# Patient Record
Sex: Female | Born: 1960 | Race: Black or African American | Hispanic: No | Marital: Married | Smoking: Never smoker
Health system: Southern US, Community
[De-identification: ages and names within clinical notes are randomized; demographics above are authoritative.]

## PROBLEM LIST (undated history)

## (undated) DIAGNOSIS — M549 Dorsalgia, unspecified: Secondary | ICD-10-CM

## (undated) DIAGNOSIS — F32A Depression, unspecified: Secondary | ICD-10-CM

## (undated) DIAGNOSIS — Z460 Encounter for fitting and adjustment of spectacles and contact lenses: Secondary | ICD-10-CM

## (undated) DIAGNOSIS — F909 Attention-deficit hyperactivity disorder, unspecified type: Secondary | ICD-10-CM

## (undated) DIAGNOSIS — F329 Major depressive disorder, single episode, unspecified: Secondary | ICD-10-CM

## (undated) DIAGNOSIS — M1990 Unspecified osteoarthritis, unspecified site: Secondary | ICD-10-CM

## (undated) DIAGNOSIS — I4891 Unspecified atrial fibrillation: Secondary | ICD-10-CM

## (undated) HISTORY — PX: DILATION AND CURETTAGE OF UTERUS: SHX78

## (undated) HISTORY — PX: UPPER GI ENDOSCOPY: SHX6162

## (undated) HISTORY — PX: COSMETIC SURGERY: SHX468

## (undated) HISTORY — PX: TUBAL LIGATION: SHX77

## (undated) HISTORY — PX: CARPAL TUNNEL RELEASE: SHX101

## (undated) HISTORY — PX: HERNIA REPAIR: SHX51

## (undated) HISTORY — PX: OTHER SURGICAL HISTORY: SHX169

## (undated) HISTORY — DX: Unspecified osteoarthritis, unspecified site: M19.90

---

## 1996-09-25 HISTORY — PX: ABDOMINAL HYSTERECTOMY: SHX81

## 1999-02-11 ENCOUNTER — Other Ambulatory Visit: Admission: RE | Admit: 1999-02-11 | Discharge: 1999-02-11 | Payer: Self-pay | Admitting: Obstetrics and Gynecology

## 1999-03-10 ENCOUNTER — Emergency Department (HOSPITAL_COMMUNITY): Admission: EM | Admit: 1999-03-10 | Discharge: 1999-03-10 | Payer: Self-pay | Admitting: Emergency Medicine

## 1999-07-27 ENCOUNTER — Ambulatory Visit (HOSPITAL_COMMUNITY): Admission: RE | Admit: 1999-07-27 | Discharge: 1999-07-27 | Payer: Self-pay | Admitting: Obstetrics and Gynecology

## 1999-07-27 ENCOUNTER — Encounter (INDEPENDENT_AMBULATORY_CARE_PROVIDER_SITE_OTHER): Payer: Self-pay

## 2000-04-09 ENCOUNTER — Encounter: Payer: Self-pay | Admitting: Obstetrics and Gynecology

## 2000-04-09 ENCOUNTER — Inpatient Hospital Stay (HOSPITAL_COMMUNITY): Admission: AD | Admit: 2000-04-09 | Discharge: 2000-04-09 | Payer: Self-pay | Admitting: Obstetrics and Gynecology

## 2005-07-03 ENCOUNTER — Ambulatory Visit (HOSPITAL_COMMUNITY): Admission: RE | Admit: 2005-07-03 | Discharge: 2005-07-03 | Payer: Self-pay | Admitting: *Deleted

## 2008-04-29 ENCOUNTER — Emergency Department (HOSPITAL_COMMUNITY): Admission: EM | Admit: 2008-04-29 | Discharge: 2008-04-29 | Payer: Self-pay | Admitting: Emergency Medicine

## 2008-12-21 ENCOUNTER — Encounter: Admission: RE | Admit: 2008-12-21 | Discharge: 2008-12-21 | Payer: Self-pay | Admitting: Family Medicine

## 2009-02-10 ENCOUNTER — Encounter: Admission: RE | Admit: 2009-02-10 | Discharge: 2009-02-10 | Payer: Self-pay | Admitting: Occupational Medicine

## 2009-02-26 ENCOUNTER — Encounter: Admission: RE | Admit: 2009-02-26 | Discharge: 2009-02-26 | Payer: Self-pay | Admitting: Family Medicine

## 2009-06-16 ENCOUNTER — Encounter
Admission: RE | Admit: 2009-06-16 | Discharge: 2009-09-14 | Payer: Self-pay | Admitting: Physical Medicine & Rehabilitation

## 2009-06-21 ENCOUNTER — Ambulatory Visit: Payer: Self-pay | Admitting: Physical Medicine & Rehabilitation

## 2009-07-16 ENCOUNTER — Ambulatory Visit: Payer: Self-pay | Admitting: Physical Medicine & Rehabilitation

## 2009-08-13 ENCOUNTER — Ambulatory Visit: Payer: Self-pay | Admitting: Physical Medicine & Rehabilitation

## 2009-09-14 ENCOUNTER — Ambulatory Visit: Payer: Self-pay | Admitting: Physical Medicine & Rehabilitation

## 2010-02-17 ENCOUNTER — Encounter
Admission: RE | Admit: 2010-02-17 | Discharge: 2010-05-18 | Payer: Self-pay | Admitting: Physical Medicine & Rehabilitation

## 2010-02-22 ENCOUNTER — Ambulatory Visit: Payer: Self-pay | Admitting: Physical Medicine & Rehabilitation

## 2010-03-10 ENCOUNTER — Ambulatory Visit: Payer: Self-pay | Admitting: Physical Medicine & Rehabilitation

## 2010-03-29 ENCOUNTER — Ambulatory Visit: Payer: Self-pay | Admitting: Physical Medicine & Rehabilitation

## 2010-05-05 ENCOUNTER — Ambulatory Visit: Payer: Self-pay | Admitting: Physical Medicine & Rehabilitation

## 2010-05-24 ENCOUNTER — Encounter
Admission: RE | Admit: 2010-05-24 | Discharge: 2010-08-22 | Payer: Self-pay | Source: Home / Self Care | Admitting: Physical Medicine & Rehabilitation

## 2010-05-26 ENCOUNTER — Ambulatory Visit: Payer: Self-pay | Admitting: Physical Medicine & Rehabilitation

## 2010-05-26 ENCOUNTER — Emergency Department (HOSPITAL_COMMUNITY): Admission: EM | Admit: 2010-05-26 | Discharge: 2010-05-26 | Payer: Self-pay | Admitting: Emergency Medicine

## 2010-05-26 ENCOUNTER — Inpatient Hospital Stay (HOSPITAL_COMMUNITY): Admission: AD | Admit: 2010-05-26 | Discharge: 2010-05-31 | Payer: Self-pay | Admitting: Psychiatry

## 2010-05-26 ENCOUNTER — Ambulatory Visit: Payer: Self-pay | Admitting: Psychiatry

## 2010-06-24 ENCOUNTER — Ambulatory Visit: Payer: Self-pay | Admitting: Physical Medicine & Rehabilitation

## 2010-07-11 ENCOUNTER — Ambulatory Visit: Payer: Self-pay | Admitting: Physical Medicine & Rehabilitation

## 2010-12-08 LAB — RAPID URINE DRUG SCREEN, HOSP PERFORMED
Barbiturates: NOT DETECTED
Cocaine: NOT DETECTED
Opiates: NOT DETECTED

## 2010-12-08 LAB — DIFFERENTIAL
Basophils Absolute: 0 10*3/uL (ref 0.0–0.1)
Basophils Relative: 1 % (ref 0–1)
Eosinophils Absolute: 0.1 10*3/uL (ref 0.0–0.7)
Monocytes Relative: 5 % (ref 3–12)
Neutro Abs: 2.8 10*3/uL (ref 1.7–7.7)
Neutrophils Relative %: 61 % (ref 43–77)

## 2010-12-08 LAB — BASIC METABOLIC PANEL
Calcium: 9.2 mg/dL (ref 8.4–10.5)
Creatinine, Ser: 0.78 mg/dL (ref 0.4–1.2)
GFR calc Af Amer: >60 mL/min (ref >60)
GFR calc non Af Amer: >60 mL/min (ref >60)
Glucose, Bld: 98 mg/dL (ref 70–99)
Sodium: 141 mEq/L (ref 135–145)

## 2010-12-08 LAB — CBC
Hemoglobin: 13.4 g/dL (ref 12.0–15.0)
MCH: 30.3 pg (ref 26.0–34.0)
MCHC: 34.1 g/dL (ref 30.0–36.0)
Platelets: 190 10*3/uL (ref 150–400)

## 2010-12-08 LAB — LIPID PANEL
HDL: 90 mg/dL (ref >39)
Total CHOL/HDL Ratio: 2.7 RATIO
Triglycerides: 69 mg/dL (ref <150)
VLDL: 14 mg/dL (ref 0–40)

## 2011-06-23 LAB — DIFFERENTIAL
Basophils Absolute: 0.1
Eosinophils Absolute: 0.1
Eosinophils Relative: 2
Lymphocytes Relative: 42
Lymphs Abs: 1.9
Monocytes Absolute: 0.2

## 2011-06-23 LAB — COMPREHENSIVE METABOLIC PANEL
ALT: 35
AST: 33
Albumin: 3.6
CO2: 28
Chloride: 107
Creatinine, Ser: 0.72
GFR calc Af Amer: >60
GFR calc non Af Amer: >60
Sodium: 141
Total Bilirubin: 0.8

## 2011-06-23 LAB — CBC
MCV: 89
Platelets: 211
RBC: 4.43
WBC: 4.6

## 2011-06-23 LAB — POCT CARDIAC MARKERS
Myoglobin, poc: 25.3
Troponin i, poc: <0.05
Troponin i, poc: <0.05

## 2011-07-04 ENCOUNTER — Ambulatory Visit: Payer: No Typology Code available for payment source | Admitting: Physical Therapy

## 2011-07-05 ENCOUNTER — Ambulatory Visit: Payer: No Typology Code available for payment source | Attending: Orthopaedic Surgery | Admitting: Physical Therapy

## 2011-07-05 DIAGNOSIS — M545 Low back pain, unspecified: Secondary | ICD-10-CM | POA: Insufficient documentation

## 2011-07-05 DIAGNOSIS — M546 Pain in thoracic spine: Secondary | ICD-10-CM | POA: Insufficient documentation

## 2011-07-05 DIAGNOSIS — IMO0001 Reserved for inherently not codable concepts without codable children: Secondary | ICD-10-CM | POA: Insufficient documentation

## 2011-07-05 DIAGNOSIS — M2560 Stiffness of unspecified joint, not elsewhere classified: Secondary | ICD-10-CM | POA: Insufficient documentation

## 2011-07-10 ENCOUNTER — Ambulatory Visit: Payer: No Typology Code available for payment source | Admitting: Physical Therapy

## 2011-07-11 ENCOUNTER — Ambulatory Visit: Payer: No Typology Code available for payment source | Admitting: Physical Therapy

## 2011-07-12 ENCOUNTER — Encounter: Payer: No Typology Code available for payment source | Admitting: Physical Therapy

## 2011-07-14 ENCOUNTER — Ambulatory Visit: Payer: No Typology Code available for payment source | Admitting: Physical Therapy

## 2011-07-18 ENCOUNTER — Ambulatory Visit: Payer: No Typology Code available for payment source | Admitting: Physical Therapy

## 2011-07-20 ENCOUNTER — Ambulatory Visit: Payer: No Typology Code available for payment source | Admitting: Physical Therapy

## 2011-07-26 ENCOUNTER — Ambulatory Visit: Payer: No Typology Code available for payment source | Admitting: Physical Therapy

## 2012-05-25 ENCOUNTER — Encounter (HOSPITAL_BASED_OUTPATIENT_CLINIC_OR_DEPARTMENT_OTHER): Payer: Self-pay | Admitting: *Deleted

## 2012-05-25 ENCOUNTER — Emergency Department (HOSPITAL_BASED_OUTPATIENT_CLINIC_OR_DEPARTMENT_OTHER)
Admission: EM | Admit: 2012-05-25 | Discharge: 2012-05-25 | Disposition: A | Discharge status: Home / Self Care | Payer: Self-pay | Attending: Emergency Medicine | Admitting: Emergency Medicine

## 2012-05-25 ENCOUNTER — Emergency Department (HOSPITAL_BASED_OUTPATIENT_CLINIC_OR_DEPARTMENT_OTHER): Payer: Self-pay

## 2012-05-25 DIAGNOSIS — R040 Epistaxis: Secondary | ICD-10-CM | POA: Insufficient documentation

## 2012-05-25 DIAGNOSIS — K529 Noninfective gastroenteritis and colitis, unspecified: Secondary | ICD-10-CM

## 2012-05-25 DIAGNOSIS — F909 Attention-deficit hyperactivity disorder, unspecified type: Secondary | ICD-10-CM | POA: Insufficient documentation

## 2012-05-25 DIAGNOSIS — R7989 Other specified abnormal findings of blood chemistry: Secondary | ICD-10-CM | POA: Insufficient documentation

## 2012-05-25 DIAGNOSIS — K5289 Other specified noninfective gastroenteritis and colitis: Secondary | ICD-10-CM | POA: Insufficient documentation

## 2012-05-25 DIAGNOSIS — Z79899 Other long term (current) drug therapy: Secondary | ICD-10-CM | POA: Insufficient documentation

## 2012-05-25 DIAGNOSIS — N390 Urinary tract infection, site not specified: Secondary | ICD-10-CM | POA: Insufficient documentation

## 2012-05-25 HISTORY — DX: Attention-deficit hyperactivity disorder, unspecified type: F90.9

## 2012-05-25 LAB — CBC WITH DIFFERENTIAL/PLATELET
Basophils Absolute: 0 10*3/uL (ref 0.0–0.1)
Lymphocytes Relative: 39 % (ref 12–46)
Lymphs Abs: 2 10*3/uL (ref 0.7–4.0)
Neutro Abs: 2.4 10*3/uL (ref 1.7–7.7)
Platelets: 200 10*3/uL (ref 150–400)
RBC: 4.38 MIL/uL (ref 3.87–5.11)
RDW: 13.5 % (ref 11.5–15.5)
WBC: 5.1 10*3/uL (ref 4.0–10.5)

## 2012-05-25 LAB — URINE MICROSCOPIC-ADD ON

## 2012-05-25 LAB — URINALYSIS, ROUTINE W REFLEX MICROSCOPIC
Bilirubin Urine: NEGATIVE
Glucose, UA: NEGATIVE mg/dL
Hgb urine dipstick: NEGATIVE
Protein, ur: NEGATIVE mg/dL

## 2012-05-25 LAB — COMPREHENSIVE METABOLIC PANEL
ALT: 106 U/L — ABNORMAL HIGH (ref 0–35)
AST: 60 U/L — ABNORMAL HIGH (ref 0–37)
Alkaline Phosphatase: 164 U/L — ABNORMAL HIGH (ref 39–117)
CO2: 28 mEq/L (ref 19–32)
Chloride: 104 mEq/L (ref 96–112)
GFR calc non Af Amer: >90 mL/min (ref >90)
Potassium: 3.9 mEq/L (ref 3.5–5.1)
Sodium: 140 mEq/L (ref 135–145)
Total Bilirubin: 0.3 mg/dL (ref 0.3–1.2)

## 2012-05-25 MED ORDER — HYDROMORPHONE HCL PF 1 MG/ML IJ SOLN
1.0000 mg | Freq: Once | INTRAMUSCULAR | Status: AC
  Administered 2012-05-25: 1 mg via INTRAVENOUS
  Filled 2012-05-25: qty 1

## 2012-05-25 MED ORDER — ONDANSETRON 8 MG PO TBDP: 8.0000 mg | ORAL_TABLET | Freq: Three times a day (TID) | ORAL | Status: AC | PRN

## 2012-05-25 MED ORDER — IOHEXOL 300 MG/ML  SOLN
100.0000 mL | Freq: Once | INTRAMUSCULAR | Status: AC | PRN
  Administered 2012-05-25: 100 mL via INTRAVENOUS

## 2012-05-25 MED ORDER — SODIUM CHLORIDE 0.9 % IV BOLUS (SEPSIS)
1000.0000 mL | Freq: Once | INTRAVENOUS | Status: AC
  Administered 2012-05-25: 1000 mL via INTRAVENOUS

## 2012-05-25 MED ORDER — ONDANSETRON HCL 4 MG/2ML IJ SOLN
4.0000 mg | Freq: Once | INTRAMUSCULAR | Status: AC
  Administered 2012-05-25: 4 mg via INTRAVENOUS
  Filled 2012-05-25: qty 2

## 2012-05-25 MED ORDER — CIPROFLOXACIN HCL 500 MG PO TABS: 500.0000 mg | ORAL_TABLET | Freq: Two times a day (BID) | ORAL | Status: AC

## 2012-05-25 MED ORDER — METRONIDAZOLE 500 MG PO TABS: 500.0000 mg | ORAL_TABLET | Freq: Two times a day (BID) | ORAL | Status: AC

## 2012-05-25 NOTE — ED Notes (Addendum)
Patient states she went to urgent care on Thursday for UTI & nausea,  Was given a prescription for cipro and promethazine. Came in today due to nosebleed and rash on lower body, She stated that she was concerned because she read that nosebleeds are an indicator of possible renal failure, Experiencing chills at night, Still experiencing nausea after eating. C/O lower back & abd pain

## 2012-05-25 NOTE — ED Provider Notes (Signed)
History     CSN: [id number]  Arrival date & time 05/25/12  1011   First MD Initiated Contact with Patient 05/25/12 1043      Chief Complaint  Patient presents with  . Epistaxis    (Consider location/radiation/quality/duration/timing/severity/associated sxs/prior treatment) HPI Patient diagnosed with uti on Thursday and treated with phenergan and started cipro.  Patient had back pain and suprapubic tenderness.  Patient has had 10 days of symptoms beginning with urgency, frequency, followed by temp to 103 and chills Wednesday to Thursday.  Patient with nausea but continues to take cipro.  Awoke with nosebleed for 15 minutes today.  Controlled with placing head back and cool towel.  Denies history of nosebleeds.  Patient states rash on legs with multiple visits to dermatolgist for same but states similar to  One that goes along with renal failure, denies history of same.  Denies history of lupus.   Past Medical History  Diagnosis Date  . ADHD (attention deficit hyperactivity disorder)     Past Surgical History  Procedure Date  . Cesarean section   . Abdominal hysterectomy   . Cosmetic surgery     stomach  . Ovary       rupture    No family history on file.  History  Substance Use Topics  . Smoking status: Never Smoker   . Smokeless tobacco: Not on file  . Alcohol Use: No    OB History    Grav Para Term Preterm Abortions TAB SAB Ect Mult Living                  Review of Systems  Constitutional: Negative for fever, chills, activity change, appetite change and unexpected weight change.  HENT: Negative for sore throat, rhinorrhea, neck pain, neck stiffness and sinus pressure.   Eyes: Negative for visual disturbance.  Respiratory: Negative for cough and shortness of breath.   Cardiovascular: Negative for chest pain and leg swelling.  Gastrointestinal: Negative for vomiting, abdominal pain, diarrhea and blood in stool.  Genitourinary: Negative for dysuria, urgency,  frequency, vaginal discharge and difficulty urinating.  Musculoskeletal: Negative for myalgias, arthralgias and gait problem.  Skin: Negative for color change and rash.  Neurological: Negative for weakness, light-headedness and headaches.  Hematological: Does not bruise/bleed easily.  Psychiatric/Behavioral: Negative for dysphoric mood.    Allergies  Review of patient's allergies indicates no known allergies.  Home Medications   Current Outpatient Rx  Name Route Sig Dispense Refill  . ATOMOXETINE HCL 60 MG PO CAPS Oral Take 60 mg by mouth daily.    . WELLBUTRIN PO Oral Take by mouth.    Marland Kitchen VITAMIN B-12 ER PO Oral Take by mouth.    Marland Kitchen PRISTIQ PO Oral Take by mouth.    Marland Kitchen GABAPENTIN PO Oral Take by mouth as needed.    Marland Kitchen GLUCOSE BLOOD VI STRP Other 1 each by Other route as needed. Use as instructed    . MULTI-VITAMIN/MINERALS PO TABS Oral Take 1 tablet by mouth daily.    Marland Kitchen VITAMIN D MAINTENANCE PO Oral Take by mouth.    Marland Kitchen PROBIOTIC DAILY PO Oral Take by mouth.      BP 140/68  Temp 98.4 F (36.9 C) (Oral)  Resp 19  SpO2 100%  Physical Exam  Nursing note and vitals reviewed. Constitutional: She is oriented to person, place, and time. She appears well-developed and well-nourished.  HENT:  Head: Atraumatic.  Mouth/Throat: Oropharynx is clear and moist.  Eyes: Conjunctivae and EOM are normal.  Pupils are equal, round, and reactive to light.  Neck: Normal range of motion. Neck supple.  Cardiovascular: Normal rate, regular rhythm and normal heart sounds.   Pulmonary/Chest: Effort normal and breath sounds normal.  Abdominal: Soft.       Moderate diffuse abdominal tenderness, right cva tenderness  Musculoskeletal: Normal range of motion.  Neurological: She is alert and oriented to person, place, and time.  Skin: Skin is warm.       Annular rash bilateral lower extremities.   Psychiatric: She has a normal mood and affect. Her behavior is normal. Judgment and thought content normal.     ED Course  Procedures (including critical care time)  Labs Reviewed - No data to display No results found.   No diagnosis found.  Results for orders placed during the hospital encounter of 05/25/12  CBC WITH DIFFERENTIAL      Component Value Range   WBC 5.1  4.0 - 10.5 K/uL   RBC 4.38  3.87 - 5.11 MIL/uL   Hemoglobin 12.9  12.0 - 15.0 g/dL   HCT 40.9  81.1 - 91.4 %   MCV 89.3  78.0 - 100.0 fL   MCH 29.5  26.0 - 34.0 pg   MCHC 33.0  30.0 - 36.0 g/dL   RDW 78.2  95.6 - 21.3 %   Platelets 200  150 - 400 K/uL   Neutrophils Relative 48  43 - 77 %   Neutro Abs 2.4  1.7 - 7.7 K/uL   Lymphocytes Relative 39  12 - 46 %   Lymphs Abs 2.0  0.7 - 4.0 K/uL   Monocytes Relative 11  3 - 12 %   Monocytes Absolute 0.6  0.1 - 1.0 K/uL   Eosinophils Relative 1  0 - 5 %   Eosinophils Absolute 0.1  0.0 - 0.7 K/uL   Basophils Relative 0  0 - 1 %   Basophils Absolute 0.0  0.0 - 0.1 K/uL  COMPREHENSIVE METABOLIC PANEL      Component Value Range   Sodium 140  135 - 145 mEq/L   Potassium 3.9  3.5 - 5.1 mEq/L   Chloride 104  96 - 112 mEq/L   CO2 28  19 - 32 mEq/L   Glucose, Bld 112 (*) 70 - 99 mg/dL   BUN 10  6 - 23 mg/dL   Creatinine, Ser 0.86  0.50 - 1.10 mg/dL   Calcium 9.2  8.4 - 57.8 mg/dL   Total Protein 7.6  6.0 - 8.3 g/dL   Albumin 3.4 (*) 3.5 - 5.2 g/dL   AST 60 (*) 0 - 37 U/L   ALT 106 (*) 0 - 35 U/L   Alkaline Phosphatase 164 (*) 39 - 117 U/L   Total Bilirubin 0.3  0.3 - 1.2 mg/dL   GFR calc non Af Amer >90  >90 mL/min   GFR calc Af Amer >90  >90 mL/min  LIPASE, BLOOD      Component Value Range   Lipase 23  11 - 59 U/L  URINALYSIS, ROUTINE W REFLEX MICROSCOPIC      Component Value Range   Color, Urine YELLOW  YELLOW   APPearance CLEAR  CLEAR   Specific Gravity, Urine 1.018  1.005 - 1.030   pH 5.0  5.0 - 8.0   Glucose, UA NEGATIVE  NEGATIVE mg/dL   Hgb urine dipstick NEGATIVE  NEGATIVE   Bilirubin Urine NEGATIVE  NEGATIVE   Ketones, ur NEGATIVE  NEGATIVE mg/dL    Protein,  ur NEGATIVE  NEGATIVE mg/dL   Urobilinogen, UA 0.2  0.0 - 1.0 mg/dL   Nitrite NEGATIVE  NEGATIVE   Leukocytes, UA MODERATE (*) NEGATIVE  URINE MICROSCOPIC-ADD ON      Component Value Range   Squamous Epithelial / LPF FEW (*) RARE   WBC, UA 11-20  <3 WBC/hpf   Bacteria, UA FEW (*) RARE   Urine-Other MUCOUS PRESENT       MDM   Patient complaining of abdominal pain  With history of recently diagnosed uti.  She had also had a nose bleed this a.m. And was concerned because she was reading of possible associations on line.  Patient with increased stool on ct- patient states always constipated but no new change.  LFTs elevated and discussed with patient possible etiologies and to avoid liver toxic substances and have lfts rechecked this week.  Also regional enteritis seen on ct, patient will have cipro extended to 10 days and flagyl prescried.        Hilario Quarry, MD 05/25/12 [phone]

## 2013-03-04 ENCOUNTER — Emergency Department (HOSPITAL_BASED_OUTPATIENT_CLINIC_OR_DEPARTMENT_OTHER)
Admission: EM | Admit: 2013-03-04 | Discharge: 2013-03-04 | Disposition: A | Discharge status: Home / Self Care | Payer: Medicare Other | Attending: Emergency Medicine | Admitting: Emergency Medicine

## 2013-03-04 ENCOUNTER — Encounter (HOSPITAL_BASED_OUTPATIENT_CLINIC_OR_DEPARTMENT_OTHER): Payer: Self-pay | Admitting: *Deleted

## 2013-03-04 ENCOUNTER — Emergency Department (HOSPITAL_BASED_OUTPATIENT_CLINIC_OR_DEPARTMENT_OTHER): Payer: Medicare Other

## 2013-03-04 DIAGNOSIS — K2970 Gastritis, unspecified, without bleeding: Secondary | ICD-10-CM | POA: Insufficient documentation

## 2013-03-04 DIAGNOSIS — R5381 Other malaise: Secondary | ICD-10-CM | POA: Insufficient documentation

## 2013-03-04 DIAGNOSIS — R5383 Other fatigue: Secondary | ICD-10-CM | POA: Insufficient documentation

## 2013-03-04 DIAGNOSIS — Z8679 Personal history of other diseases of the circulatory system: Secondary | ICD-10-CM | POA: Insufficient documentation

## 2013-03-04 DIAGNOSIS — Z79899 Other long term (current) drug therapy: Secondary | ICD-10-CM | POA: Insufficient documentation

## 2013-03-04 DIAGNOSIS — F909 Attention-deficit hyperactivity disorder, unspecified type: Secondary | ICD-10-CM | POA: Insufficient documentation

## 2013-03-04 DIAGNOSIS — R531 Weakness: Secondary | ICD-10-CM

## 2013-03-04 HISTORY — DX: Unspecified atrial fibrillation: I48.91

## 2013-03-04 LAB — URINALYSIS, ROUTINE W REFLEX MICROSCOPIC
Glucose, UA: NEGATIVE mg/dL
Hgb urine dipstick: NEGATIVE
Protein, ur: NEGATIVE mg/dL
Specific Gravity, Urine: 1.025 (ref 1.005–1.030)
pH: 6 (ref 5.0–8.0)

## 2013-03-04 LAB — CBC WITH DIFFERENTIAL/PLATELET
Basophils Absolute: 0 10*3/uL (ref 0.0–0.1)
Basophils Relative: 0 % (ref 0–1)
Eosinophils Absolute: 0.1 10*3/uL (ref 0.0–0.7)
Eosinophils Relative: 1 % (ref 0–5)
Lymphocytes Relative: 50 % — ABNORMAL HIGH (ref 12–46)
MCHC: 33.7 g/dL (ref 30.0–36.0)
MCV: 88.5 fL (ref 78.0–100.0)
Monocytes Absolute: 0.3 10*3/uL (ref 0.1–1.0)
Platelets: 183 10*3/uL (ref 150–400)
RDW: 13.3 % (ref 11.5–15.5)
WBC: 5.8 10*3/uL (ref 4.0–10.5)

## 2013-03-04 LAB — D-DIMER, QUANTITATIVE: D-Dimer, Quant: <0.27 ug/mL-FEU (ref 0.00–0.48)

## 2013-03-04 LAB — COMPREHENSIVE METABOLIC PANEL
ALT: 13 U/L (ref 0–35)
AST: 18 U/L (ref 0–37)
Albumin: 3.7 g/dL (ref 3.5–5.2)
CO2: 26 mEq/L (ref 19–32)
Calcium: 9.4 mg/dL (ref 8.4–10.5)
Creatinine, Ser: 0.6 mg/dL (ref 0.50–1.10)
Sodium: 141 mEq/L (ref 135–145)
Total Protein: 7.3 g/dL (ref 6.0–8.3)

## 2013-03-04 LAB — URINE MICROSCOPIC-ADD ON

## 2013-03-04 MED ORDER — SODIUM CHLORIDE 0.9 % IV BOLUS (SEPSIS)
1000.0000 mL | Freq: Once | INTRAVENOUS | Status: AC
  Administered 2013-03-04: 1000 mL via INTRAVENOUS

## 2013-03-04 MED ORDER — PANTOPRAZOLE SODIUM 40 MG IV SOLR
40.0000 mg | Freq: Once | INTRAVENOUS | Status: AC
  Administered 2013-03-04: 40 mg via INTRAVENOUS
  Filled 2013-03-04: qty 40

## 2013-03-04 NOTE — ED Provider Notes (Signed)
History     CSN: [id number]  Arrival date & time 03/04/13  1539   First MD Initiated Contact with Patient 03/04/13 1545      Chief Complaint  Patient presents with  . Chest Pain    (Consider location/radiation/quality/duration/timing/severity/associated sxs/prior treatment) Patient is a 52 y.o. female presenting with chest pain.  Chest Pain  Pt with reported history of atrial fibrillation reports she has had general weakness for the last week or so. She has noticed some mild SOB for 3 days, no wheezing, cough, orthopnea or DOE. She was 'sweating profusely' 3 days ago as well and has had occasional intermittent mild aching pain in lower chest, although she points to her epigastric area No particular provoking or relieving factors. She reports family history of CAD, but no other CAD or PE risk factors. She has been taking ASA since symptoms started. Was taking Motrin occasionally prior to that but not every day. She is a moderate drinker, drinks approx 2 bottles of wine per week. Denies any nausea or vomiting.  Past Medical History  Diagnosis Date  . ADHD (attention deficit hyperactivity disorder)   . Atrial fibrillation     Past Surgical History  Procedure Laterality Date  . Cesarean section    . Abdominal hysterectomy    . Cosmetic surgery      stomach  . Ovary        rupture    No family history on file.  History  Substance Use Topics  . Smoking status: Never Smoker   . Smokeless tobacco: Not on file  . Alcohol Use: Yes    OB History   Grav Para Term Preterm Abortions TAB SAB Ect Mult Living                  Review of Systems  Cardiovascular: Positive for chest pain.   All other systems reviewed and are negative except as noted in HPI.   Allergies  Review of patient's allergies indicates no known allergies.  Home Medications   Current Outpatient Rx  Name  Route  Sig  Dispense  Refill  . atomoxetine (STRATTERA) 60 MG capsule   Oral   Take 60 mg by mouth  daily.         . BuPROPion HCl (WELLBUTRIN PO)   Oral   Take by mouth.         . Cyanocobalamin (VITAMIN B-12 ER PO)   Oral   Take by mouth.         . Desvenlafaxine Succinate (PRISTIQ PO)   Oral   Take by mouth.         Marland Kitchen GABAPENTIN PO   Oral   Take by mouth as needed.         Marland Kitchen glucose blood test strip   Other   1 each by Other route as needed. Use as instructed         . Multiple Vitamins-Minerals (MULTIVITAMIN WITH MINERALS) tablet   Oral   Take 1 tablet by mouth daily.         . Nutritional Supplements (VITAMIN D MAINTENANCE PO)   Oral   Take by mouth.         . Probiotic Product (PROBIOTIC DAILY PO)   Oral   Take by mouth.           BP 114/80  Pulse 81  Temp(Src) 98.3 F (36.8 C) (Oral)  Resp 20  SpO2 100%  Physical Exam  Nursing note and  vitals reviewed. Constitutional: She is oriented to person, place, and time. She appears well-developed and well-nourished.  HENT:  Head: Normocephalic and atraumatic.  Eyes: EOM are normal. Pupils are equal, round, and reactive to light.  Neck: Normal range of motion. Neck supple.  Cardiovascular: Normal rate, normal heart sounds and intact distal pulses.   Pulmonary/Chest: Effort normal and breath sounds normal. She exhibits no tenderness.  Abdominal: Bowel sounds are normal. She exhibits no distension. There is tenderness (mild epigastric, reproduces pain). There is no rebound and no guarding.  Musculoskeletal: Normal range of motion. She exhibits no edema and no tenderness.  Neurological: She is alert and oriented to person, place, and time. She has normal strength. No cranial nerve deficit or sensory deficit.  Skin: Skin is warm and dry. No rash noted.  Psychiatric: She has a normal mood and affect.    ED Course  Procedures (including critical care time)  Labs Reviewed  CBC WITH DIFFERENTIAL - Abnormal; Notable for the following:    Lymphocytes Relative 50 (*)    All other components  within normal limits  COMPREHENSIVE METABOLIC PANEL - Abnormal; Notable for the following:    Glucose, Bld 107 (*)    Alkaline Phosphatase 136 (*)    Total Bilirubin 0.2 (*)    All other components within normal limits  URINALYSIS, ROUTINE W REFLEX MICROSCOPIC - Abnormal; Notable for the following:    Ketones, ur 15 (*)    Leukocytes, UA SMALL (*)    All other components within normal limits  LIPASE, BLOOD  D-DIMER, QUANTITATIVE  URINE MICROSCOPIC-ADD ON   Dg Chest 2 View  03/04/2013   *RADIOLOGY REPORT*  Clinical Data: Chest pain. Shortness of breath.  CHEST - 2 VIEW  Comparison: 05/25/2012  Findings: Oval shaped 0.7 x 0.4 cm density projects over the anterior left second rib near the crossover with the fourth rib and scapula.  The lungs appear otherwise clear. Cardiac and mediastinal contours appear unremarkable.  No pleural effusion observed.  IMPRESSION: 1.  Small nodular density left lung apex probably represents a bony lesion. The likelihood that this represents a neoplastic pulmonary lesions is very low.  Consider apical lordotic view of the chest for further characterization.  Otherwise negative exam.   Original Report Authenticated By: Gaylyn Rong, M.D.     1. Gastritis   2. General weakness       MDM   Date: 03/04/2013  Rate: 77  Rhythm: normal sinus rhythm  QRS Axis: normal  Intervals: normal  ST/T Wave abnormalities: normal  Conduction Disutrbances: none  Narrative Interpretation: unremarkable  Pt's reported history of atrial fibrillation is not clear. She states she was seen by a cardiologist who told her she had afib and had her wear a 30 day monitor, but she never heard any results from it and never followed up. Not currently on any treatment.    5:56 PM Pt feeling better. Labs and imaging reviewed. Pt informed of incidental finding on CXR and advised PCP follow up for further evaluation of her general weakness. I suspect her epigastric pain is gastritis or  less likely PUD. PCP follow up is already scheduled for next week.         Charles B. Bernette Mayers, MD 03/04/13 1759

## 2013-03-04 NOTE — ED Notes (Signed)
Chest pain and sob x 3 days. Went to UC this am and left without being seen. Hx of atrial fib.

## 2013-06-20 ENCOUNTER — Encounter (HOSPITAL_BASED_OUTPATIENT_CLINIC_OR_DEPARTMENT_OTHER): Payer: Self-pay | Admitting: *Deleted

## 2013-06-20 NOTE — Progress Notes (Signed)
Pt recently had carpal tunnel-has infection started on antibiotic 06/19/13-told to call dr Irven Coe office. Saw a cardiologist 6-7 yr ago for /af-cannot remember who-was in er 6/14 cp-ruled as gastritis-no meds-

## 2013-06-23 ENCOUNTER — Encounter (HOSPITAL_BASED_OUTPATIENT_CLINIC_OR_DEPARTMENT_OTHER): Payer: Self-pay | Admitting: Anesthesiology

## 2013-06-23 ENCOUNTER — Ambulatory Visit (HOSPITAL_BASED_OUTPATIENT_CLINIC_OR_DEPARTMENT_OTHER): Payer: BC Managed Care – PPO | Admitting: Anesthesiology

## 2013-06-23 ENCOUNTER — Encounter (HOSPITAL_BASED_OUTPATIENT_CLINIC_OR_DEPARTMENT_OTHER)
Admission: RE | Disposition: A | Discharge status: Home / Self Care | Payer: Self-pay | Source: Ambulatory Visit | Attending: Specialist

## 2013-06-23 ENCOUNTER — Encounter (HOSPITAL_BASED_OUTPATIENT_CLINIC_OR_DEPARTMENT_OTHER): Payer: Self-pay | Admitting: *Deleted

## 2013-06-23 ENCOUNTER — Ambulatory Visit (HOSPITAL_BASED_OUTPATIENT_CLINIC_OR_DEPARTMENT_OTHER)
Admission: RE | Admit: 2013-06-23 | Discharge: 2013-06-23 | Disposition: A | Discharge status: Home / Self Care | Payer: BC Managed Care – PPO | Source: Ambulatory Visit | Attending: Specialist | Admitting: Specialist

## 2013-06-23 DIAGNOSIS — N62 Hypertrophy of breast: Secondary | ICD-10-CM | POA: Insufficient documentation

## 2013-06-23 HISTORY — DX: Major depressive disorder, single episode, unspecified: F32.9

## 2013-06-23 HISTORY — DX: Depression, unspecified: F32.A

## 2013-06-23 HISTORY — PX: BREAST REDUCTION SURGERY: SHX8

## 2013-06-23 HISTORY — DX: Encounter for fitting and adjustment of spectacles and contact lenses: Z46.0

## 2013-06-23 SURGERY — MAMMOPLASTY, REDUCTION
Anesthesia: General | Site: Breast | Laterality: Bilateral | Wound class: Clean

## 2013-06-23 MED ORDER — CEFAZOLIN SODIUM-DEXTROSE 2-3 GM-% IV SOLR
2.0000 g | INTRAVENOUS | Status: AC
  Administered 2013-06-23: 2 g via INTRAVENOUS

## 2013-06-23 MED ORDER — OXYCODONE HCL 5 MG PO TABS: 5.0000 mg | ORAL_TABLET | Freq: Once | ORAL | Status: DC | PRN

## 2013-06-23 MED ORDER — SCOPOLAMINE 1 MG/3DAYS TD PT72: 1.0000 | MEDICATED_PATCH | Freq: Once | TRANSDERMAL | Status: DC

## 2013-06-23 MED ORDER — MORPHINE SULFATE 10 MG/ML IJ SOLN
INTRAMUSCULAR | Status: DC | PRN
  Administered 2013-06-23: 2 mg via INTRAVENOUS
  Administered 2013-06-23: 4 mg via INTRAVENOUS

## 2013-06-23 MED ORDER — LABETALOL HCL 5 MG/ML IV SOLN
INTRAVENOUS | Status: DC | PRN
  Administered 2013-06-23: 5 mg via INTRAVENOUS

## 2013-06-23 MED ORDER — HYDROMORPHONE HCL PF 1 MG/ML IJ SOLN
0.2500 mg | INTRAMUSCULAR | Status: DC | PRN
  Administered 2013-06-23 (×2): 0.5 mg via INTRAVENOUS

## 2013-06-23 MED ORDER — ONDANSETRON HCL 4 MG/2ML IJ SOLN
INTRAMUSCULAR | Status: DC | PRN
  Administered 2013-06-23: 4 mg via INTRAVENOUS

## 2013-06-23 MED ORDER — MIDAZOLAM HCL 5 MG/5ML IJ SOLN
INTRAMUSCULAR | Status: DC | PRN
  Administered 2013-06-23: 2 mg via INTRAVENOUS

## 2013-06-23 MED ORDER — PROMETHAZINE HCL 25 MG/ML IJ SOLN: 6.2500 mg | INTRAMUSCULAR | Status: DC | PRN

## 2013-06-23 MED ORDER — LIDOCAINE HCL (CARDIAC) 20 MG/ML IV SOLN
INTRAVENOUS | Status: DC | PRN
  Administered 2013-06-23: 100 mg via INTRAVENOUS

## 2013-06-23 MED ORDER — FENTANYL CITRATE 0.05 MG/ML IJ SOLN: 50.0000 ug | Freq: Once | INTRAMUSCULAR | Status: DC

## 2013-06-23 MED ORDER — SUCCINYLCHOLINE CHLORIDE 20 MG/ML IJ SOLN
INTRAMUSCULAR | Status: DC | PRN
  Administered 2013-06-23: 100 mg via INTRAVENOUS

## 2013-06-23 MED ORDER — PROPOFOL 10 MG/ML IV BOLUS
INTRAVENOUS | Status: DC | PRN
  Administered 2013-06-23: 200 mg via INTRAVENOUS

## 2013-06-23 MED ORDER — DEXAMETHASONE SODIUM PHOSPHATE 4 MG/ML IJ SOLN
INTRAMUSCULAR | Status: DC | PRN
  Administered 2013-06-23: 10 mg via INTRAVENOUS

## 2013-06-23 MED ORDER — FENTANYL CITRATE 0.05 MG/ML IJ SOLN
INTRAMUSCULAR | Status: DC | PRN
  Administered 2013-06-23: 100 ug via INTRAVENOUS

## 2013-06-23 MED ORDER — MIDAZOLAM HCL 2 MG/2ML IJ SOLN: 1.0000 mg | INTRAMUSCULAR | Status: DC | PRN

## 2013-06-23 MED ORDER — FENTANYL CITRATE 0.05 MG/ML IJ SOLN: 50.0000 ug | INTRAMUSCULAR | Status: DC | PRN

## 2013-06-23 MED ORDER — SODIUM CHLORIDE 0.9 % IV SOLN
INTRAVENOUS | Status: DC | PRN
  Administered 2013-06-23: 08:00:00

## 2013-06-23 MED ORDER — OXYCODONE HCL 5 MG/5ML PO SOLN: 5.0000 mg | Freq: Once | ORAL | Status: DC | PRN

## 2013-06-23 MED ORDER — LACTATED RINGERS IV SOLN
INTRAVENOUS | Status: DC
  Administered 2013-06-23: 07:00:00 via INTRAVENOUS

## 2013-06-23 SURGICAL SUPPLY — 59 items
APL SKNCLS STERI-STRIP NONHPOA (GAUZE/BANDAGES/DRESSINGS) ×2
BAG DECANTER FOR FLEXI CONT (MISCELLANEOUS) ×2 IMPLANT
BENZOIN TINCTURE PRP APPL 2/3 (GAUZE/BANDAGES/DRESSINGS) ×4 IMPLANT
BINDER BREAST XLRG (GAUZE/BANDAGES/DRESSINGS) IMPLANT
BINDER BREAST XXLRG (GAUZE/BANDAGES/DRESSINGS) ×1 IMPLANT
BLADE KNIFE  20 PERSONNA (BLADE) ×2
BLADE KNIFE 20 PERSONNA (BLADE) ×2 IMPLANT
BLADE KNIFE PERSONA 15 (BLADE) ×2 IMPLANT
CANISTER SUCTION 1200CC (MISCELLANEOUS) ×2 IMPLANT
CLOTH BEACON ORANGE TIMEOUT ST (SAFETY) ×2 IMPLANT
COVER MAYO STAND STRL (DRAPES) ×2 IMPLANT
COVER TABLE BACK 60X90 (DRAPES) ×2 IMPLANT
DECANTER SPIKE VIAL GLASS SM (MISCELLANEOUS) ×4 IMPLANT
DRAIN CHANNEL 10F 3/8 F FF (DRAIN) ×4 IMPLANT
DRAPE LAPAROSCOPIC ABDOMINAL (DRAPES) ×2 IMPLANT
DRAPE UTILITY XL STRL (DRAPES) ×2 IMPLANT
DRSG PAD ABDOMINAL 8X10 ST (GAUZE/BANDAGES/DRESSINGS) ×8 IMPLANT
ELECT REM PT RETURN 9FT ADLT (ELECTROSURGICAL) ×2
ELECTRODE REM PT RTRN 9FT ADLT (ELECTROSURGICAL) ×1 IMPLANT
EVACUATOR SILICONE 100CC (DRAIN) ×4 IMPLANT
FILTER 7/8 IN (FILTER) IMPLANT
GAUZE XEROFORM 5X9 LF (GAUZE/BANDAGES/DRESSINGS) ×4 IMPLANT
GLOVE BIO SURGEON STRL SZ 6.5 (GLOVE) ×2 IMPLANT
GLOVE BIOGEL M STRL SZ7.5 (GLOVE) ×2 IMPLANT
GLOVE BIOGEL PI IND STRL 8 (GLOVE) ×1 IMPLANT
GLOVE BIOGEL PI INDICATOR 8 (GLOVE) ×1
GLOVE ECLIPSE 7.0 STRL STRAW (GLOVE) ×2 IMPLANT
GOWN PREVENTION PLUS XXLARGE (GOWN DISPOSABLE) ×4 IMPLANT
IV NS 500ML (IV SOLUTION) ×2
IV NS 500ML BAXH (IV SOLUTION) ×1 IMPLANT
NDL SPNL 18GX3.5 QUINCKE PK (NEEDLE) ×1 IMPLANT
NEEDLE SPNL 18GX3.5 QUINCKE PK (NEEDLE) ×2 IMPLANT
NS IRRIG 1000ML POUR BTL (IV SOLUTION) IMPLANT
PACK BASIN DAY SURGERY FS (CUSTOM PROCEDURE TRAY) ×2 IMPLANT
PEN SKIN MARKING BROAD TIP (MISCELLANEOUS) ×3 IMPLANT
PILLOW FOAM RUBBER ADULT (PILLOWS) ×2 IMPLANT
PIN SAFETY STERILE (MISCELLANEOUS) ×2 IMPLANT
SHEETING SILICONE GEL EPI DERM (MISCELLANEOUS) IMPLANT
SLEEVE SCD COMPRESS KNEE MED (MISCELLANEOUS) ×2 IMPLANT
SPECIMEN JAR MEDIUM (MISCELLANEOUS) IMPLANT
SPECIMEN JAR X LARGE (MISCELLANEOUS) ×4 IMPLANT
SPONGE GAUZE 4X4 12PLY (GAUZE/BANDAGES/DRESSINGS) ×4 IMPLANT
SPONGE LAP 18X18 X RAY DECT (DISPOSABLE) ×8 IMPLANT
STRIP SUTURE WOUND CLOSURE 1/2 (SUTURE) ×10 IMPLANT
SUT MNCRL AB 3-0 PS2 18 (SUTURE) ×12 IMPLANT
SUT MON AB 2-0 CT1 36 (SUTURE) IMPLANT
SUT MON AB 5-0 PS2 18 (SUTURE) ×4 IMPLANT
SUT PROLENE 3 0 PS 2 (SUTURE) ×12 IMPLANT
SYR 50ML LL SCALE MARK (SYRINGE) ×4 IMPLANT
SYR CONTROL 10ML LL (SYRINGE) IMPLANT
TAPE HYPAFIX 6X30 (GAUZE/BANDAGES/DRESSINGS) ×2 IMPLANT
TAPE MEASURE 72IN RETRACT (INSTRUMENTS) ×1
TAPE MEASURE LINEN 72IN RETRCT (INSTRUMENTS) IMPLANT
TAPE PAPER MEDFIX 1IN X 10YD (GAUZE/BANDAGES/DRESSINGS) ×2 IMPLANT
TOWEL OR NON WOVEN STRL DISP B (DISPOSABLE) ×2 IMPLANT
TUBE CONNECTING 20X1/4 (TUBING) ×2 IMPLANT
UNDERPAD 30X30 INCONTINENT (UNDERPADS AND DIAPERS) ×6 IMPLANT
VAC PENCILS W/TUBING CLEAR (MISCELLANEOUS) ×2 IMPLANT
YANKAUER SUCT BULB TIP NO VENT (SUCTIONS) ×2 IMPLANT

## 2013-06-23 NOTE — Anesthesia Preprocedure Evaluation (Signed)
Anesthesia Evaluation  Patient identified by MRN, date of birth, ID band Patient awake    Reviewed: Allergy & Precautions, H&P , NPO status , Patient's Chart, lab work & pertinent test results  Airway Mallampati: II TM Distance: <3 FB Neck ROM: Full    Dental   Pulmonary  breath sounds clear to auscultation        Cardiovascular + dysrhythmias Atrial Fibrillation Rhythm:Regular Rate:Normal     Neuro/Psych Depression    GI/Hepatic   Endo/Other    Renal/GU      Musculoskeletal   Abdominal (+) + obese,   Peds  Hematology   Anesthesia Other Findings   Reproductive/Obstetrics                           Anesthesia Physical Anesthesia Plan  ASA: II  Anesthesia Plan: General   Post-op Pain Management:    Induction: Intravenous  Airway Management Planned: Oral ETT  Additional Equipment:   Intra-op Plan:   Post-operative Plan: Extubation in OR  Informed Consent: I have reviewed the patients History and Physical, chart, labs and discussed the procedure including the risks, benefits and alternatives for the proposed anesthesia with the patient or authorized representative who has indicated his/her understanding and acceptance.     Plan Discussed with: CRNA and Surgeon  Anesthesia Plan Comments:         Anesthesia Quick Evaluation

## 2013-06-23 NOTE — Anesthesia Postprocedure Evaluation (Signed)
  Anesthesia Post-op Note  Patient: Shelia Gibbs  Procedure(s) Performed: Procedure(s): MAMMARY REDUCTION  (BREAST) (Bilateral)  Patient Location: PACU  Anesthesia Type:General  Level of Consciousness: awake and alert   Airway and Oxygen Therapy: Patient Spontanous Breathing  Post-op Pain: mild  Post-op Assessment: Post-op Vital signs reviewed, Patient's Cardiovascular Status Stable, Respiratory Function Stable, Patent Airway, No signs of Nausea or vomiting and Pain level controlled  Post-op Vital Signs: Reviewed and stable  Complications: No apparent anesthesia complications

## 2013-06-23 NOTE — Brief Op Note (Signed)
06/23/2013  9:55 AM  PATIENT:  Shelia Gibbs  52 y.o. female  PRE-OPERATIVE DIAGNOSIS:  MACROMASTIA  POST-OPERATIVE DIAGNOSIS:  MACROMASTIA  PROCEDURE:  Procedure(s): MAMMARY REDUCTION  (BREAST) (Bilateral)  SURGEON:  Surgeon(s) and Role:    * Louisa Second, MD - Primary  PHYSICIAN ASSISTANT:   ASSISTANTS: none   ANESTHESIA:   general  EBL:  Total I/O In: 2000 [I.V.:2000] Out: -   BLOOD ADMINISTERED:none  DRAINS: (right and left lateral chest areas) Jackson-Pratt drain(s) with closed bulb suction in the right and left chest ares    LOCAL MEDICATIONS USED:  LIDOCAINE   SPECIMEN:  Excision  DISPOSITION OF SPECIMEN:  PATHOLOGY  COUNTS:  YES  TOURNIQUET:  * No tourniquets in log *  DICTATION: .Other Dictation: Dictation Number [id number]  PLAN OF CARE: Discharge to home after PACU  PATIENT DISPOSITION:  PACU - hemodynamically stable.   Delay start of Pharmacological VTE agent (>24hrs) due to surgical blood loss or risk of bleeding: yes

## 2013-06-23 NOTE — Anesthesia Procedure Notes (Signed)
Procedure Name: Intubation Performed by: York Grice Pre-anesthesia Checklist: Patient identified, Timeout performed, Emergency Drugs available, Suction available and Patient being monitored Patient Re-evaluated:Patient Re-evaluated prior to inductionOxygen Delivery Method: Circle system utilized Preoxygenation: Pre-oxygenation with 100% oxygen Intubation Type: IV induction Ventilation: Mask ventilation without difficulty Laryngoscope Size: Miller and 2 Grade View: Grade I Tube type: Oral Tube size: 7.0 mm Number of attempts: 1 Airway Equipment and Method: Stylet Placement Confirmation: breath sounds checked- equal and bilateral and positive ETCO2 Secured at: 21 cm Tube secured with: Tape Dental Injury: Teeth and Oropharynx as per pre-operative assessment

## 2013-06-23 NOTE — Transfer of Care (Signed)
Immediate Anesthesia Transfer of Care Note  Patient: Shelia Gibbs  Procedure(s) Performed: Procedure(s): MAMMARY REDUCTION  (BREAST) (Bilateral)  Patient Location: PACU  Anesthesia Type:General  Level of Consciousness: awake and sedated  Airway & Oxygen Therapy: Patient Spontanous Breathing and Patient connected to face mask oxygen  Post-op Assessment: Report given to PACU RN and Post -op Vital signs reviewed and stable  Post vital signs: Reviewed and stable  Complications: No apparent anesthesia complications

## 2013-06-23 NOTE — H&P (Signed)
Shelia Gibbs is an 52 y.o. female.   Chief Complaint: Severe macromastia HPI: Increased back and shoulder pain with hx of rashes  Past Medical History  Diagnosis Date  . ADHD (attention deficit hyperactivity disorder)   . Contact lens/glasses fitting     wears contacts or glasses  . Atrial fibrillation     hx-about 06-saw dr in [place] rem[place] who  . ADHD (attention deficit hyperactivity disorder)   . Depression     Past Surgical History  Procedure Laterality Date  . Cesarean section      x2  . Cosmetic surgery      stomach  . Ovary        rupture  . Carpal tunnel release      rt and lt sept,may 2014  . Abdominal hysterectomy  1998  . Tubal ligation    . Dilation and curettage of uterus      History reviewed. No pertinent family history. Social History:  reports that she has never smoked. She does not have any smokeless tobacco history on file. She reports that  drinks alcohol. She reports that she does not use illicit drugs.  Allergies: No Known Allergies  Medications Prior to Admission  Medication Sig Dispense Refill  . amphetamine-dextroamphetamine (ADDERALL XR) 10 MG 24 hr capsule Take 10 mg by mouth every morning.      Marland Kitchen atomoxetine (STRATTERA) 60 MG capsule Take 60 mg by mouth daily.      . cephALEXin (KEFLEX) 500 MG capsule Take 500 mg by mouth 3 (three) times daily.      . Cyanocobalamin (VITAMIN B-12 ER PO) Take by mouth.      Marland Kitchen GABAPENTIN PO Take by mouth as needed.      Marland Kitchen ibuprofen (ADVIL,MOTRIN) 800 MG tablet Take 800 mg by mouth every 8 (eight) hours as needed for pain.      . Multiple Vitamins-Minerals (MULTIVITAMIN WITH MINERALS) tablet Take 1 tablet by mouth daily.      . Nutritional Supplements (VITAMIN D MAINTENANCE PO) Take by mouth.        No results found for this or any previous visit (from the past 48 hour(s)). No results found.  ROS  Blood pressure 118/85, pulse 86, temperature 97.9 F (36.6 C), temperature source Oral, resp.  rate 16, height 5\' 2"  (1.575 m), weight 83.122 kg (183 lb 4 oz), SpO2 100.00%. Physical Exam   Assessment/PlanSevere macromastis for bilateral breast reductions and excision of accessory breast tissue  Endi Lagman L 06/23/2013, 9:53 AM

## 2013-06-23 NOTE — H&P (Signed)
Shelia Gibbs is an 52 y.o. female.   Chief Complaint: Increased macromastia HPI: Increased back and shoulder pain sec to increased macromastia and accessory braest tissue  Past Medical History  Diagnosis Date  . ADHD (attention deficit hyperactivity disorder)   . Contact lens/glasses fitting     wears contacts or glasses  . Atrial fibrillation     hx-about 06-saw dr in [place] who  . ADHD (attention deficit hyperactivity disorder)   . Depression     Past Surgical History  Procedure Laterality Date  . Cesarean section      x2  . Cosmetic surgery      stomach  . Ovary        rupture  . Carpal tunnel release      rt and lt sept,may 2014  . Abdominal hysterectomy  1998  . Tubal ligation    . Dilation and curettage of uterus      History reviewed. No pertinent family history. Social History:  reports that she has never smoked. She does not have any smokeless tobacco history on file. She reports that  drinks alcohol. She reports that she does not use illicit drugs.  Allergies: No Known Allergies  Medications Prior to Admission  Medication Sig Dispense Refill  . amphetamine-dextroamphetamine (ADDERALL XR) 10 MG 24 hr capsule Take 10 mg by mouth every morning.      Marland Kitchen atomoxetine (STRATTERA) 60 MG capsule Take 60 mg by mouth daily.      . cephALEXin (KEFLEX) 500 MG capsule Take 500 mg by mouth 3 (three) times daily.      . Cyanocobalamin (VITAMIN B-12 ER PO) Take by mouth.      Marland Kitchen GABAPENTIN PO Take by mouth as needed.      Marland Kitchen ibuprofen (ADVIL,MOTRIN) 800 MG tablet Take 800 mg by mouth every 8 (eight) hours as needed for pain.      . Multiple Vitamins-Minerals (MULTIVITAMIN WITH MINERALS) tablet Take 1 tablet by mouth daily.      . Nutritional Supplements (VITAMIN D MAINTENANCE PO) Take by mouth.        No results found for this or any previous visit (from the past 48 hour(s)). No results found.  Review of Systems  Constitutional: Negative.   HENT:  Positive for neck pain.   Eyes: Negative.   Respiratory: Negative.   Cardiovascular: Negative.   Gastrointestinal: Negative.   Genitourinary: Negative.   Musculoskeletal: Positive for back pain.  Skin: Positive for rash.  Neurological: Positive for headaches.  Endo/Heme/Allergies: Negative.   Psychiatric/Behavioral: The patient is nervous/anxious.     Blood pressure 118/85, pulse 86, temperature 97.9 F (36.6 C), temperature source Oral, resp. rate 16, height 5\' 2"  (1.575 m), weight 83.122 kg (183 lb 4 oz), SpO2 100.00%. Physical Exam   Assessment/Plan Severe macromastia for bilateral breast reductions and excisision of accessory breast tissue  Faraaz Wolin L 06/23/2013, 7:29 AM

## 2013-06-24 ENCOUNTER — Encounter (HOSPITAL_BASED_OUTPATIENT_CLINIC_OR_DEPARTMENT_OTHER): Payer: Self-pay | Admitting: Specialist

## 2013-06-24 NOTE — Op Note (Deleted)
Shelia Gibbs, Shelia Gibbs             ACCOUNT NO.:  [id number]  MEDICAL RECORD NO.:  [id number]  LOCATION:                                FACILITY:  [facility]  PHYSICIAN:  Earvin Hansen L. Shon Hough, M.D.DATE OF BIRTH:  [birth year]  DATE OF PROCEDURE:  06/23/2013 DATE OF DISCHARGE:  06/23/2013                              OPERATIVE REPORT   A 52 year old lady with severe macromastia, back and shoulder pain secondary to large pendulous breasts, increased accessory breast tissue.  PROCEDURES DONE:  Bilateral breast reductions using the inferior pedicle technique __________ accessory breast tissue bilaterally.  Anesthesia is general.  Preoperatively, the patient was sat up and drawn for the inferior pedicle reduction mammoplasty remarking the nipple-areolar complex from over 35 cm to 22.  She underwent general anesthesia, intubated orally, prep was done to the chest, breast areas in routine fashion using Hibiclens soap and solution, walled off with sterile towels and drapes so as to make a sterile field.  Injection of 0.25% Xylocaine with epinephrine 1:400, 000 concentration was done, 200 mL per side.  This allowed __________ then the wound was closed with #15 blade. Skin of the inferior pedicle was de-epithelialized with #20 blade. Medial and lateral fatty dermal pedicles were incised down to the underlying pectoralis major fascia.  Laterally, more tissue was removed, increased accessory breast tissue sharply using the Bovie anticoagulation and cutting.  After proper hemostasis and irrigation, the inferior pedicle was de-epithelialized and after proper hemostasis, the flaps were transposed and stayed with 3-0 Prolene sutures. Subcutaneous closure was done with 3-0 Monocryl x2 layers and then running subcuticular stitch of 3-0 Monocryl and 5-0 Monocryl throughout the __________.  The wounds were drained with #10 fully fluted Blake drains, which were placed in the depths of the wound and brought  out through the lateral-most portion.  The incision was secured with 3-0 Prolene.  At the end of the procedure, the nipple-areolar complexes were examined showing excellent blood supply and suppleness.  The wounds were cleansed Steri-Strips and soft dressing were applied including Xeroform, 4 x 4s, ABDs, Hypafix tape.  She withstood the procedures very well, was taken to the recovery room in excellent condition.     Yaakov Guthrie. Shon Hough, M.D.     Cathie Hoops  D:  06/23/2013  T:  06/24/2013  Job:  [id number]

## 2013-06-24 NOTE — Op Note (Signed)
NAME:  Pitre, Jurni             ACCOUNT NO.:  [id number]  MEDICAL RECORD NO.:  [id number]  LOCATION:                                FACILITY:  [facility]  PHYSICIAN:  Shervon Kerwin L. Shastina Rua, M.D.DATE OF BIRTH:  01/26/1961  DATE OF PROCEDURE:  06/23/2013 DATE OF DISCHARGE:  06/23/2013                              OPERATIVE REPORT   A 52-year-old lady with severe macromastia, back and shoulder pain secondary to large pendulous breasts, increased accessory breast tissue.  PROCEDURES DONE:  Bilateral breast reductions using the inferior pedicle technique __________ accessory breast tissue bilaterally.  Anesthesia is general.  Preoperatively, the patient was sat up and drawn for the inferior pedicle reduction mammoplasty remarking the nipple-areolar complex from over 35 cm to 22.  She underwent general anesthesia, intubated orally, prep was done to the chest, breast areas in routine fashion using Hibiclens soap and solution, walled off with sterile towels and drapes so as to make a sterile field.  Injection of 0.25% Xylocaine with epinephrine 1:400, 000 concentration was done, 200 mL per side.  This allowed __________ then the wound was closed with #15 blade. Skin of the inferior pedicle was de-epithelialized with #20 blade. Medial and lateral fatty dermal pedicles were incised down to the underlying pectoralis major fascia.  Laterally, more tissue was removed, increased accessory breast tissue sharply using the Bovie anticoagulation and cutting.  After proper hemostasis and irrigation, the inferior pedicle was de-epithelialized and after proper hemostasis, the flaps were transposed and stayed with 3-0 Prolene sutures. Subcutaneous closure was done with 3-0 Monocryl x2 layers and then running subcuticular stitch of 3-0 Monocryl and 5-0 Monocryl throughout the __________.  The wounds were drained with #10 fully fluted Blake drains, which were placed in the depths of the wound and brought  out through the lateral-most portion.  The incision was secured with 3-0 Prolene.  At the end of the procedure, the nipple-areolar complexes were examined showing excellent blood supply and suppleness.  The wounds were cleansed Steri-Strips and soft dressing were applied including Xeroform, 4 x 4s, ABDs, Hypafix tape.  She withstood the procedures very well, was taken to the recovery room in excellent condition.     Akshara Blumenthal L. Obed Samek, M.D.     GLT/MEDQ  D:  06/23/2013  T:  06/24/2013  Job:  [id number] 

## 2015-11-08 ENCOUNTER — Other Ambulatory Visit: Payer: Self-pay | Admitting: Orthopaedic Surgery

## 2015-11-08 DIAGNOSIS — M545 Low back pain: Secondary | ICD-10-CM

## 2015-11-14 ENCOUNTER — Ambulatory Visit
Admission: RE | Admit: 2015-11-14 | Discharge: 2015-11-14 | Disposition: A | Discharge status: Home / Self Care | Payer: Medicare Other | Source: Ambulatory Visit | Attending: Orthopaedic Surgery | Admitting: Orthopaedic Surgery

## 2015-11-14 DIAGNOSIS — M545 Low back pain: Secondary | ICD-10-CM

## 2016-05-02 ENCOUNTER — Encounter (HOSPITAL_BASED_OUTPATIENT_CLINIC_OR_DEPARTMENT_OTHER): Payer: Self-pay

## 2016-05-02 ENCOUNTER — Emergency Department (HOSPITAL_BASED_OUTPATIENT_CLINIC_OR_DEPARTMENT_OTHER)
Admission: EM | Admit: 2016-05-02 | Discharge: 2016-05-02 | Disposition: A | Discharge status: Home / Self Care | Payer: BLUE CROSS/BLUE SHIELD | Attending: Emergency Medicine | Admitting: Emergency Medicine

## 2016-05-02 ENCOUNTER — Emergency Department (HOSPITAL_BASED_OUTPATIENT_CLINIC_OR_DEPARTMENT_OTHER): Payer: BLUE CROSS/BLUE SHIELD

## 2016-05-02 DIAGNOSIS — H539 Unspecified visual disturbance: Secondary | ICD-10-CM | POA: Diagnosis not present

## 2016-05-02 DIAGNOSIS — R110 Nausea: Secondary | ICD-10-CM | POA: Insufficient documentation

## 2016-05-02 DIAGNOSIS — R5383 Other fatigue: Secondary | ICD-10-CM | POA: Insufficient documentation

## 2016-05-02 DIAGNOSIS — R51 Headache: Secondary | ICD-10-CM | POA: Insufficient documentation

## 2016-05-02 DIAGNOSIS — Z79818 Long term (current) use of other agents affecting estrogen receptors and estrogen levels: Secondary | ICD-10-CM | POA: Diagnosis not present

## 2016-05-02 DIAGNOSIS — R519 Headache, unspecified: Secondary | ICD-10-CM

## 2016-05-02 HISTORY — DX: Dorsalgia, unspecified: M54.9

## 2016-05-02 MED ORDER — PROCHLORPERAZINE EDISYLATE 5 MG/ML IJ SOLN
10.0000 mg | Freq: Once | INTRAMUSCULAR | Status: AC
  Administered 2016-05-02: 5 mg via INTRAVENOUS
  Filled 2016-05-02: qty 2

## 2016-05-02 MED ORDER — BUTALBITAL-APAP-CAFFEINE 50-325-40 MG PO TABS: 1.0000 | ORAL_TABLET | Freq: Four times a day (QID) | ORAL | 0 refills | Status: AC | PRN

## 2016-05-02 MED ORDER — SODIUM CHLORIDE 0.9 % IV BOLUS (SEPSIS)
500.0000 mL | Freq: Once | INTRAVENOUS | Status: AC
  Administered 2016-05-02: 500 mL via INTRAVENOUS

## 2016-05-02 NOTE — ED Provider Notes (Signed)
[name] DEPT Provider Note   CSN: [id number] Arrival date & time: 05/02/16  1901  First Provider Contact:  First MD Initiated Contact with Patient 05/02/16 2028       By signing my name below, I, Shanna Cisco, attest that this documentation has been prepared under the direction and in the presence of Neko Mcgeehan, PA-C. Electronically signed by: Shanna Cisco, ED Scribe. 05/02/16. 8:37 PM.    History   Chief Complaint Chief Complaint  Patient presents with  . Headache     The history is provided by the patient. No language interpreter was used.   HPI Comments:  Shelia Gibbs is a 55 y.o. female who presents to the Emergency Department complaining of gradual onset right sided headache which started four days ago. Pain is characterized as dull, throbbing and intermittent. Associated symptoms include facial tingling on right cheek, nausea, fatigue, and chills. She also mentioned blurry vision, which started a couple of days ago. Pt wears contacts and glasses, but has not recently due to the HA. Pt denies light sensitivity, vomiting, dizziness, fever, neck stiffness, or any other complaints.      Past Medical History:  Diagnosis Date  . ADHD (attention deficit hyperactivity disorder)   . ADHD (attention deficit hyperactivity disorder)   . Atrial fibrillation ([place])    hx-about 06-saw dr in [place] remeber who  . Back pain   . Contact lens/glasses fitting    wears contacts or glasses  . Depression     There are no active problems to display for this patient.   Past Surgical History:  Procedure Laterality Date  . ABDOMINAL HYSTERECTOMY  1998  . BREAST REDUCTION SURGERY Bilateral 06/23/2013   Procedure: MAMMARY REDUCTION  (BREAST);  Surgeon: Cristine Polio, MD;  Location: [facility];  Service: Plastics;  Laterality: Bilateral;  . CARPAL TUNNEL RELEASE     rt and lt sept,may 2014  . CESAREAN SECTION     x2  . COSMETIC SURGERY     stomach  .  DILATION AND CURETTAGE OF UTERUS    . ovary       rupture  . TUBAL LIGATION      OB History    No data available       Home Medications    Prior to Admission medications   Medication Sig Start Date End Date Taking? Authorizing Provider  estrogens, conjugated, (PREMARIN) 0.625 MG tablet Take 0.625 mg by mouth daily. Take daily for 21 days then do not take for 7 days.   Yes Historical Provider, MD  atomoxetine (STRATTERA) 60 MG capsule Take 60 mg by mouth daily.    Historical Provider, MD  butalbital-acetaminophen-caffeine (FIORICET) 50-325-40 MG tablet Take 1 tablet by mouth every 6 (six) hours as needed for headache. 05/02/16 05/02/17  Juliana Boling C Jerline Linzy, PA-C  Cyanocobalamin (VITAMIN B-12 ER PO) Take by mouth.    Historical Provider, MD  GABAPENTIN PO Take by mouth as needed.    Historical Provider, MD  Nutritional Supplements (VITAMIN D MAINTENANCE PO) Take by mouth.    Historical Provider, MD    Family History No family history on file.  Social History Social History  Substance Use Topics  . Smoking status: Never Smoker  . Smokeless tobacco: Never Used  . Alcohol use Yes     Comment: occ     Allergies   Review of patient's allergies indicates no known allergies.   Review of Systems Review of Systems  Constitutional: Positive for fatigue. Negative  for fever.  Eyes: Positive for visual disturbance.       Blurry vision  Gastrointestinal: Positive for nausea. Negative for vomiting.  Musculoskeletal: Negative for back pain, neck pain and neck stiffness.  Neurological: Positive for headaches. Negative for dizziness, syncope, facial asymmetry, speech difficulty, weakness, light-headedness and numbness.  All other systems reviewed and are negative.    Physical Exam Updated Vital Signs BP (!) 140/106 (BP Location: Left Arm)   Pulse 110   Temp 98.3 F (36.8 C) (Oral)   Resp 18   Ht 5\' 2"  (1.575 m)   Wt 187 lb (84.8 kg)   SpO2 98%   BMI 34.20 kg/m   Physical Exam    Constitutional: She is oriented to person, place, and time. She appears well-developed and well-nourished. No distress.  HENT:  Head: Normocephalic and atraumatic.  Eyes: Conjunctivae and EOM are normal. Pupils are equal, round, and reactive to light.  Neck: Neck supple.  Cardiovascular: Normal rate, regular rhythm, normal heart sounds and intact distal pulses.   Pulmonary/Chest: Effort normal and breath sounds normal. No respiratory distress.  Abdominal: Soft. There is no tenderness. There is no guarding.  Musculoskeletal: Normal range of motion.  Full ROM in all extremities and spine. No midline spinal tenderness.   Lymphadenopathy:    She has no cervical adenopathy.  Neurological: She is alert and oriented to person, place, and time. She has normal reflexes.  Right sided paresthesias across the zygoma. No other sensory abnormalities. Strength 5/5 in all extremities. No gait disturbance. Coordination intact. Cranial nerves III-XII grossly intact. No facial droop.   Skin: Skin is warm and dry. She is not diaphoretic.  Psychiatric: She has a normal mood and affect. Her behavior is normal.  Nursing note and vitals reviewed.    ED Treatments / Results  DIAGNOSTIC STUDIES:  Oxygen Saturation is 98% on room air, normal by my interpretation.    COORDINATION OF CARE:  8:22 PM Discussed treatment plan with pt at bedside and pt agreed to plan.  Labs (all labs ordered are listed, but only abnormal results are displayed) Labs Reviewed  SEDIMENTATION RATE    EKG  EKG Interpretation None       Radiology Ct Head Wo Contrast  Result Date: 05/02/2016 CLINICAL DATA:  Right-sided headache for 5 days with nausea. EXAM: CT HEAD WITHOUT CONTRAST TECHNIQUE: Contiguous axial images were obtained from the base of the skull through the vertex without intravenous contrast. COMPARISON:  11/24/2015 FINDINGS: There is no evidence for acute hemorrhage, hydrocephalus, mass lesion, or abnormal  extra-axial fluid collection. No definite CT evidence for acute infarction. The visualized paranasal sinuses and mastoid air cells are clear. IMPRESSION: Normal exam. Electronically Signed   By: 01/24/2016 M.D.   On: 05/02/2016 21:27    Procedures Procedures (including critical care time)  Medications Ordered in ED Medications  prochlorperazine (COMPAZINE) injection 10 mg (5 mg Intravenous Given 05/02/16 2130)  sodium chloride 0.9 % bolus 500 mL (0 mLs Intravenous Stopped 05/02/16 2149)    Orthostatic VS for the past 24 hrs:  BP- Lying Pulse- Lying BP- Sitting Pulse- Sitting BP- Standing at 0 minutes Pulse- Standing at 0 minutes  05/02/16 2125 130/85 104 (!) 136/91 106 (!) 135/92 108     Visual Acuity  Right Eye Distance: 20/200 Left Eye Distance: 20/200 Bilateral Distance: 20/70  Right Eye Near:   Left Eye Near:    Bilateral Near:    The above values were without corrective lenses.  Initial  Impression / Assessment and Plan / ED Course  I have reviewed the triage vital signs and the nursing notes.  Pertinent labs & imaging results that were available during my care of the patient were reviewed by me and considered in my medical decision making (see chart for details).  Clinical Course    ETHAN KASPERSKI presents with intermittent headache for the last 4 days.  Findings and plan of care discussed with Orlie Dakin, MD. Dr. Winfred Leeds personally evaluated and examined this patient.  Patient had an reported episode of dizziness while in the CT scanner. No loss of consciousness. Patient then admits that she has had headaches like this in the past, but no specific diagnosis was made. Reevaluation immediately after this issue revealed no changes in the patient's status. A short time later, patient was reevaluated again following the Compazine and states that her headache has resolved completely. No abnormalities are found on repeat exam. Lab computers were down with results being  delivered by hand. Patient's ESR was 17. She has no complaints upon discharge. The patient was given instructions for home care as well as return precautions. Patient voices understanding of these instructions, accepts the plan, and is comfortable with discharge.   Vitals:   05/02/16 1911 05/02/16 2149  BP: (!) 140/106 130/74  Pulse: 110 104  Resp: 18 18  Temp: 98.3 F (36.8 C)   TempSrc: Oral   SpO2: 98% 99%  Weight: 84.8 kg   Height: '5\' 2"'$  (1.575 m)      Final Clinical Impressions(s) / ED Diagnoses   Final diagnoses:  Acute nonintractable headache, unspecified headache type     New Prescriptions Discharge Medication List as of 05/02/2016 10:53 PM    START taking these medications   Details  butalbital-acetaminophen-caffeine (FIORICET) 50-325-40 MG tablet Take 1 tablet by mouth every 6 (six) hours as needed for headache., Starting Tue 05/02/2016, Until Wed 05/02/2017, Print       I personally performed the services described in this documentation, which was scribed in my presence. The recorded information has been reviewed and is accurate.    Lorayne Bender, PA-C 05/03/16 [place], MD 05/03/16 1537

## 2016-05-02 NOTE — ED Notes (Signed)
Pt ambulated around nursing station with no difficulty.

## 2016-05-02 NOTE — ED Notes (Signed)
MD Jacobowitz at bedside at this time.

## 2016-05-02 NOTE — ED Triage Notes (Signed)
C/o HA since Friday-denies migraine hx or injury-NAD-steady gait

## 2016-05-02 NOTE — ED Notes (Signed)
MD Rennis ChrisJacobowitz advised to only give 5mg  compazine.

## 2016-05-02 NOTE — ED Notes (Signed)
Pt transported to CT at this time.

## 2016-05-02 NOTE — ED Provider Notes (Signed)
Resistance with right-sided temporal headache gradual onset 3 days ago throbbing in nature accompanied by blurred vision in right eye no fever no trauma no treatment prior to coming here. Nothing makes symptoms better or worse. On exam alert Glasgow Coma Score 15 eyes pupils equal round react to light extraocular muscles intact. No subcutaneous conjunctival erythema. She is minimally tender over temporal area. Neurologic Glasgow Coma Score 15 gait normal Romberg normal pronator drift normal DTR symmetric bilaterally at knee jerk ankle jerk and biceps toes downward going bilaterally motor strength 5 over 5 overall   Doug SouSam Roma Bondar, MD 05/02/16 2129

## 2016-05-02 NOTE — ED Notes (Signed)
Pt reports right sided headache that will is "dull." Reports pain comes and goes, will relieve with BC. Stroke screen negative. Equal grips bilaterally.

## 2016-05-02 NOTE — ED Notes (Addendum)
Pt brought back from CT at this time. CT tech reports that when patient was being sat up after the scan, pt appeared to be "falling back." Reports that pt had a "dazed" look on her face and wasn't quick to answer questions.  Pt seems confused at this time upon arrival back to room. Pt reports feeling "like I have to catch up."

## 2016-05-02 NOTE — Discharge Instructions (Signed)
You have been seen today for a headache. Your imaging and lab tests showed no abnormalities. Use the Fioricet as needed for headache. Be sure to stay well-hydrated by drinking plenty of fluids. Follow up with PCP as soon as possible for reevaluation and chronic management of this issue. Return to ED should symptoms worsen.

## 2016-05-02 NOTE — ED Notes (Signed)
Shawn, PA made aware of patient at this time. MD Rennis ChrisJacobowitz made aware of patient as well.  PA in to see patient.

## 2016-05-02 NOTE — ED Notes (Signed)
PA at bedside.

## 2016-05-03 LAB — SEDIMENTATION RATE: SED RATE: 17 mm/h (ref 0–22)

## 2016-07-23 ENCOUNTER — Encounter (HOSPITAL_BASED_OUTPATIENT_CLINIC_OR_DEPARTMENT_OTHER): Payer: Self-pay | Admitting: *Deleted

## 2016-07-23 DIAGNOSIS — J219 Acute bronchiolitis, unspecified: Secondary | ICD-10-CM | POA: Diagnosis not present

## 2016-07-23 DIAGNOSIS — R05 Cough: Secondary | ICD-10-CM | POA: Diagnosis present

## 2016-07-23 DIAGNOSIS — F909 Attention-deficit hyperactivity disorder, unspecified type: Secondary | ICD-10-CM | POA: Insufficient documentation

## 2016-07-23 DIAGNOSIS — Z792 Long term (current) use of antibiotics: Secondary | ICD-10-CM | POA: Diagnosis not present

## 2016-07-23 DIAGNOSIS — Z79899 Other long term (current) drug therapy: Secondary | ICD-10-CM | POA: Diagnosis not present

## 2016-07-23 NOTE — ED Triage Notes (Signed)
pt c/o cough x 1 week , seen by PMD x 3 days ago received ABX

## 2016-07-24 ENCOUNTER — Encounter (HOSPITAL_BASED_OUTPATIENT_CLINIC_OR_DEPARTMENT_OTHER): Payer: Self-pay | Admitting: Emergency Medicine

## 2016-07-24 ENCOUNTER — Emergency Department (HOSPITAL_BASED_OUTPATIENT_CLINIC_OR_DEPARTMENT_OTHER)
Admission: EM | Admit: 2016-07-24 | Discharge: 2016-07-24 | Disposition: A | Discharge status: Home / Self Care | Payer: BLUE CROSS/BLUE SHIELD | Attending: Emergency Medicine | Admitting: Emergency Medicine

## 2016-07-24 DIAGNOSIS — J219 Acute bronchiolitis, unspecified: Secondary | ICD-10-CM | POA: Diagnosis not present

## 2016-07-24 MED ORDER — HYDROCOD POLST-CPM POLST ER 10-8 MG/5ML PO SUER: 5.0000 mL | Freq: Two times a day (BID) | ORAL | 0 refills | Status: DC | PRN

## 2016-07-24 MED ORDER — ALBUTEROL SULFATE HFA 108 (90 BASE) MCG/ACT IN AERS
INHALATION_SPRAY | RESPIRATORY_TRACT | Status: AC
  Filled 2016-07-24: qty 6.7

## 2016-07-24 MED ORDER — HYDROCOD POLST-CPM POLST ER 10-8 MG/5ML PO SUER
5.0000 mL | Freq: Once | ORAL | Status: AC
  Administered 2016-07-24: 5 mL via ORAL
  Filled 2016-07-24: qty 5

## 2016-07-24 MED ORDER — ALBUTEROL SULFATE HFA 108 (90 BASE) MCG/ACT IN AERS
2.0000 | INHALATION_SPRAY | RESPIRATORY_TRACT | Status: DC | PRN
  Administered 2016-07-24: 2 via RESPIRATORY_TRACT

## 2016-07-24 NOTE — ED Notes (Signed)
Pt is in good condition; discharge instructions reviewed; prescription medication reviewed; follow up care reviewed; home care reviewed; patient verbalized understanding; patient is ambulatory and went home by herself.

## 2016-07-24 NOTE — ED Provider Notes (Signed)
[facility] Provider Note: Lowella DellJ. Lane Stanton Kissoon, MD, FACEP  CSN: [id number] MRN: [id number] ARRIVAL: 07/23/16 at 2337 ROOM: [facility]01/[facility]01   CHIEF COMPLAINT  Cough   HISTORY OF PRESENT ILLNESS  Shelia Gibbs is a 55 y.o. female who developed respiratory symptoms about 8 days ago. Symptoms include nasal congestion, chest congestion, cough, chest soreness with cough or deep breathing, subjective fever and sweats. She was seen by her primary care physician 3 days ago was placed on an antibiotic; she does not know the name the antibiotic. She is here complaining of a persistent dry cough. The cough occurs in paroxysms and is severe at times. It is worse when lying supine. She has been short of breath at times but not currently. She has been taking Robitussin without relief.   Past Medical History:  Diagnosis Date  . ADHD (attention deficit hyperactivity disorder)   . Atrial fibrillation ([facility])    hx-about 06-[place]cannot remeber who  . Back pain   . Contact lens/glasses fitting    wears contacts or glasses  . Depression     Past Surgical History:  Procedure Laterality Date  . ABDOMINAL HYSTERECTOMY  1998  . BREAST REDUCTION SURGERY Bilateral 06/23/2013   Procedure: MAMMARY REDUCTION  (BREAST);  Surgeon: Louisa SecondGerald Truesdale, MD;  Location: [facility];  Service: Plastics;  Laterality: Bilateral;  . CARPAL TUNNEL RELEASE     rt and lt sept,may 2014  . CESAREAN SECTION     x2  . COSMETIC SURGERY     stomach  . DILATION AND CURETTAGE OF UTERUS    . ovary       rupture  . TUBAL LIGATION      No family history on file.  Social History  Substance Use Topics  . Smoking status: Never Smoker  . Smokeless tobacco: Never Used  . Alcohol use Yes     Comment: occ    Prior to Admission medications   Medication Sig Start Date End Date Taking? Authorizing Provider  amoxicillin-clavulanate (AUGMENTIN) 875-125 MG tablet Take 1 tablet by mouth 2 (two)  times daily.   Yes Historical Provider, MD  FLUoxetine (PROZAC) 20 MG tablet Take 20 mg by mouth daily.   Yes Historical Provider, MD  atomoxetine (STRATTERA) 60 MG capsule Take 60 mg by mouth daily.    Historical Provider, MD  butalbital-acetaminophen-caffeine (FIORICET) [phone] MG tablet Take 1 tablet by mouth every 6 (six) hours as needed for headache. 05/02/16 05/02/17  Shawn C Joy, PA-C  chlorpheniramine-HYDROcodone (TUSSIONEX PENNKINETIC ER) 10-8 MG/5ML SUER Take 5 mLs by mouth every 12 (twelve) hours as needed. 07/24/16   Britlyn Martine, MD  Cyanocobalamin (VITAMIN B-12 ER PO) Take by mouth.    Historical Provider, MD  estrogens, conjugated, (PREMARIN) 0.625 MG tablet Take 0.625 mg by mouth daily. Take daily for 21 days then do not take for 7 days.    Historical Provider, MD  Nutritional Supplements (VITAMIN D MAINTENANCE PO) Take by mouth.    Historical Provider, MD    Allergies Review of patient's allergies indicates no known allergies.   REVIEW OF SYSTEMS  Negative except as noted here or in the History of Present Illness.   PHYSICAL EXAMINATION  Initial Vital Signs Blood pressure 130/86, pulse 76, temperature 98.1 F (36.7 C), temperature source Oral, resp. rate 18, height 5\' 2"  (1.575 m), weight 191 lb (86.6 kg), SpO2 99 %.  Examination General: Well-developed, well-nourished female in no acute distress; appearance consistent with age  of record HENT: normocephalic; atraumatic Eyes: pupils equal, round and reactive to light; extraocular muscles intact Neck: supple Heart: regular rate and rhythm Lungs: clear to auscultation bilaterally Abdomen: soft; nondistended; nontender; no masses or hepatosplenomegaly; bowel sounds present Extremities: No deformity; full range of motion; pulses normal Neurologic: Awake, alert and oriented; motor function intact in all extremities and symmetric; no facial droop Skin: Warm and dry Psychiatric: Normal mood and affect   RESULTS  Summary of  this visit's results, reviewed by myself:   EKG Interpretation  Date/Time:    Ventricular Rate:    PR Interval:    QRS Duration:   QT Interval:    QTC Calculation:   R Axis:     Text Interpretation:        Laboratory Studies: No results found for this or any previous visit (from the past 24 hour(s)). Imaging Studies: No results found.  ED COURSE  Nursing notes and initial vitals signs, including pulse oximetry, reviewed.  Vitals:   07/23/16 2344 07/23/16 2345 07/24/16 0137 07/24/16 0205  BP:  142/92 132/85 130/86  Pulse:  92 84 76  Resp:  20 20 18   Temp:  97.8 F (36.6 C) 98.1 F (36.7 C)   TempSrc:  Oral Oral   SpO2:  100% 100% 99%  Weight: 191 lb (86.6 kg)   191 lb (86.6 kg)  Height: 5\' 2"  (1.575 m)   5\' 2"  (1.575 m)   We will provide an albuterol inhaler and instructed her in its use. She was advised to discontinue the Robitussin and will prescribe Tussionex in its place.  PROCEDURES    ED DIAGNOSES     ICD-9-CM ICD-10-CM   1. Acute bronchiolitis with bronchospasm 466.19 J21.9        Paula LibraJohn Tamel Abel, MD 07/24/16 [phone]

## 2017-05-20 ENCOUNTER — Encounter (HOSPITAL_BASED_OUTPATIENT_CLINIC_OR_DEPARTMENT_OTHER): Payer: Self-pay | Admitting: Emergency Medicine

## 2017-05-20 ENCOUNTER — Emergency Department (HOSPITAL_BASED_OUTPATIENT_CLINIC_OR_DEPARTMENT_OTHER)
Admission: EM | Admit: 2017-05-20 | Discharge: 2017-05-20 | Disposition: A | Discharge status: Home / Self Care | Payer: BLUE CROSS/BLUE SHIELD | Attending: Emergency Medicine | Admitting: Emergency Medicine

## 2017-05-20 DIAGNOSIS — M79604 Pain in right leg: Secondary | ICD-10-CM | POA: Insufficient documentation

## 2017-05-20 DIAGNOSIS — Z5321 Procedure and treatment not carried out due to patient leaving prior to being seen by health care provider: Secondary | ICD-10-CM | POA: Insufficient documentation

## 2017-05-20 NOTE — ED Provider Notes (Signed)
Pt checked in for R leg pain. I went to see patient and she had eloped.    [place] Schorr, Ambrose Finland, MD 05/20/17 [phone]

## 2017-05-20 NOTE — ED Triage Notes (Signed)
Pt c/o pain to RLE x 4d, started behind knee and now radiates up

## 2017-05-25 ENCOUNTER — Emergency Department (HOSPITAL_BASED_OUTPATIENT_CLINIC_OR_DEPARTMENT_OTHER): Payer: BLUE CROSS/BLUE SHIELD

## 2017-05-25 ENCOUNTER — Emergency Department (HOSPITAL_BASED_OUTPATIENT_CLINIC_OR_DEPARTMENT_OTHER)
Admission: EM | Admit: 2017-05-25 | Discharge: 2017-05-25 | Disposition: A | Discharge status: Home / Self Care | Payer: BLUE CROSS/BLUE SHIELD | Attending: Emergency Medicine | Admitting: Emergency Medicine

## 2017-05-25 ENCOUNTER — Encounter (HOSPITAL_BASED_OUTPATIENT_CLINIC_OR_DEPARTMENT_OTHER): Payer: Self-pay | Admitting: *Deleted

## 2017-05-25 DIAGNOSIS — M25561 Pain in right knee: Secondary | ICD-10-CM | POA: Diagnosis present

## 2017-05-25 DIAGNOSIS — M7121 Synovial cyst of popliteal space [Baker], right knee: Secondary | ICD-10-CM

## 2017-05-25 DIAGNOSIS — Z79899 Other long term (current) drug therapy: Secondary | ICD-10-CM | POA: Insufficient documentation

## 2017-05-25 DIAGNOSIS — I4891 Unspecified atrial fibrillation: Secondary | ICD-10-CM | POA: Diagnosis not present

## 2017-05-25 MED ORDER — NAPROXEN 500 MG PO TABS: 500.0000 mg | ORAL_TABLET | Freq: Two times a day (BID) | ORAL | 0 refills | Status: AC

## 2017-05-25 NOTE — ED Notes (Signed)
Patient transported to Ultrasound 

## 2017-05-25 NOTE — Discharge Instructions (Signed)
Knee compression sleeve. Compression hose from toes to knee to avoid swelling below the compression sleeve. Naproxen a.m. and p.m. as needed for pain or swelling

## 2017-05-25 NOTE — ED Triage Notes (Signed)
Pt c/o right behind the knee pain x 2 weeks denies injury

## 2017-05-25 NOTE — ED Provider Notes (Signed)
[facility] Provider Note   CSN: [id number] Arrival date & time: 05/25/17  1435     History   Chief Complaint Chief Complaint  Patient presents with  . Knee Pain    HPI Shelia Gibbs is a 56 y.o. female.Chief complaint is right knee pain  HPI this is a 56 year old female. Said some pain by her right knee for 1-2 weeks now. No fall, injury, or trauma. Has not had chronic knee pain or arthritis. No new activities. Pain is in the back of the knee in the popliteal fossa. She feels like her leg but be swollen. She takes estrogen. She does not smoke. She's never had DVT.  Past Medical History:  Diagnosis Date  . ADHD (attention deficit hyperactivity disorder)   . Atrial fibrillation (HCC)    hx-about 06-[place] dr in [place] who  . Back pain   . Contact lens/glasses fitting    wears contacts or glasses  . Depression     There are no active problems to display for this patient.   Past Surgical History:  Procedure Laterality Date  . ABDOMINAL HYSTERECTOMY  1998  . BREAST REDUCTION SURGERY Bilateral 06/23/2013   Procedure: MAMMARY REDUCTION  (BREAST);  Surgeon: Louisa Second, MD;  Location: [facility];  Service: Plastics;  Laterality: Bilateral;  . CARPAL TUNNEL RELEASE     rt and lt sept,may 2014  . CESAREAN SECTION     x2  . COSMETIC SURGERY     stomach  . DILATION AND CURETTAGE OF UTERUS    . ovary       rupture  . TUBAL LIGATION      OB History    No data available       Home Medications    Prior to Admission medications   Medication Sig Start Date End Date Taking? Authorizing Provider  amoxicillin-clavulanate (AUGMENTIN) 875-125 MG tablet Take 1 tablet by mouth 2 (two) times daily.    [provider]  atomoxetine (STRATTERA) 60 MG capsule Take 60 mg by mouth daily.    [provider]  Cyanocobalamin (VITAMIN B-12 ER PO) Take by mouth.    [provider]  estrogens, conjugated,  (PREMARIN) 0.625 MG tablet Take 0.625 mg by mouth daily. Take daily for 21 days then do not take for 7 days.    [provider]  FLUoxetine (PROZAC) 20 MG tablet Take 20 mg by mouth daily.    [provider]  naproxen (NAPROSYN) 500 MG tablet Take 1 tablet (500 mg total) by mouth 2 (two) times daily. 05/25/17   Rolland Porter, MD  Nutritional Supplements (VITAMIN D MAINTENANCE PO) Take by mouth.    [provider]    Family History History reviewed. No pertinent family history.  Social History Social History  Substance Use Topics  . Smoking status: Never Smoker  . Smokeless tobacco: Never Used  . Alcohol use Yes     Comment: occ     Allergies   Patient has no known allergies.   Review of Systems Review of Systems  Constitutional: Negative for appetite change, chills, diaphoresis, fatigue and fever.  HENT: Negative for mouth sores, sore throat and trouble swallowing.   Eyes: Negative for visual disturbance.  Respiratory: Negative for cough, chest tightness, shortness of breath and wheezing.   Cardiovascular: Negative for chest pain.  Gastrointestinal: Negative for abdominal distention, abdominal pain, diarrhea, nausea and vomiting.  Endocrine: Negative for polydipsia, polyphagia and polyuria.  Genitourinary: Negative for  dysuria, frequency and hematuria.  Musculoskeletal: Positive for arthralgias. Negative for gait problem.  Skin: Negative for color change, pallor and rash.  Neurological: Negative for dizziness, syncope, light-headedness and headaches.  Hematological: Does not bruise/bleed easily.  Psychiatric/Behavioral: Negative for behavioral problems and confusion.     Physical Exam Updated Vital Signs BP 139/82   Pulse 91   Temp 98 F (36.7 C) (Oral)   Resp 16   Ht 5\' 2"  (1.575 m)   Wt 83.5 kg (184 lb)   SpO2 99%   BMI 33.65 kg/m   Physical Exam  Musculoskeletal:  Relatively shows full range of motion at the hip knee and ankle. She's  workup with the knee slightly flexed. No joint effusion. No erythema or warmth. Nontender along the joint line. Quadriceps mechanism intact. Tenderness and fullness along the popliteal fossa. Right lower leg is approximately 1 severe larger than the left lower leg. Good pulses and sensation     ED Treatments / Results  Labs (all labs ordered are listed, but only abnormal results are displayed) Labs Reviewed - No data to display  EKG  EKG Interpretation None       Radiology [place] Venous Img Lower Unilateral Right  Result Date: 05/25/2017 CLINICAL DATA:  Right lower extremity pain and swelling for 2 weeks. Foot burning. EXAM: RIGHT LOWER EXTREMITY VENOUS DOPPLER ULTRASOUND TECHNIQUE: Gray-scale sonography with graded compression, as well as color Doppler and duplex ultrasound were performed to evaluate the lower extremity deep venous systems from the level of the common femoral vein and including the common femoral, femoral, profunda femoral, popliteal and calf veins including the posterior tibial, peroneal and gastrocnemius veins when visible. The superficial great saphenous vein was also interrogated. Spectral Doppler was utilized to evaluate flow at rest and with distal augmentation maneuvers in the common femoral, femoral and popliteal veins. COMPARISON:  02/26/2009 FINDINGS: Contralateral Common Femoral Vein: Respiratory phasicity is normal and symmetric with the symptomatic side. No evidence of thrombus. Normal compressibility. Common Femoral Vein: No evidence of thrombus. Saphenofemoral Junction: No evidence of thrombus. Profunda Femoral Vein: No evidence of thrombus. Femoral Vein: No evidence of thrombus. Popliteal Vein: No evidence of thrombus. Calf Veins: No evidence of thrombus. Superficial Great Saphenous Vein: No evidence of thrombus. Other Findings: 3.3 x 1.6 x 1 cm avascular hypoechoic collection in the medial popliteal fossa. Joint communication was not clearly demonstrated.  IMPRESSION: 1. No evidence of DVT within the right lower extremity. 2. 3 x 1.6 cm collection in the popliteal fossa, location favoring Baker cyst. Electronically Signed   By: Marnee SpringJonathon  Watts M.D.   On: 05/25/2017 16:19    Procedures Procedures (including critical care time)  Medications Ordered in ED Medications - No data to display   Initial Impression / Assessment and Plan / ED Course  I have reviewed the triage vital signs and the nursing notes.  Pertinent labs & imaging results that were available during my care of the patient were reviewed by me and considered in my medical decision making (see chart for details).   possible/probable Baker cyst. Ultrasound for confirmation and rule out DVT.  Final Clinical Impressions(s) / ED Diagnoses   Final diagnoses:  Synovial cyst of right popliteal space    New Prescriptions New Prescriptions   NAPROXEN (NAPROSYN) 500 MG TABLET    Take 1 tablet (500 mg total) by mouth 2 (two) times daily.     Rolland PorterJames, Jermall Isaacson, MD 05/25/17 [phone]

## 2017-11-29 ENCOUNTER — Ambulatory Visit (INDEPENDENT_AMBULATORY_CARE_PROVIDER_SITE_OTHER): Payer: Self-pay

## 2017-11-29 ENCOUNTER — Ambulatory Visit (INDEPENDENT_AMBULATORY_CARE_PROVIDER_SITE_OTHER): Payer: BLUE CROSS/BLUE SHIELD | Admitting: Orthopaedic Surgery

## 2017-11-29 ENCOUNTER — Encounter (INDEPENDENT_AMBULATORY_CARE_PROVIDER_SITE_OTHER): Payer: Self-pay | Admitting: Orthopaedic Surgery

## 2017-11-29 VITALS — BP 114/58 | HR 53 | Resp 16 | Ht 64.0 in | Wt 183.0 lb

## 2017-11-29 DIAGNOSIS — M7542 Impingement syndrome of left shoulder: Secondary | ICD-10-CM | POA: Diagnosis not present

## 2017-11-29 DIAGNOSIS — M7502 Adhesive capsulitis of left shoulder: Secondary | ICD-10-CM

## 2017-11-29 DIAGNOSIS — M19012 Primary osteoarthritis, left shoulder: Secondary | ICD-10-CM

## 2017-11-29 MED ORDER — LIDOCAINE HCL 2 % IJ SOLN
2.0000 mL | INTRAMUSCULAR | Status: AC | PRN
  Administered 2017-11-29: 2 mL

## 2017-11-29 MED ORDER — BUPIVACAINE HCL 0.5 % IJ SOLN
2.0000 mL | INTRAMUSCULAR | Status: AC | PRN
  Administered 2017-11-29: 2 mL via INTRA_ARTICULAR

## 2017-11-29 MED ORDER — METHYLPREDNISOLONE ACETATE 40 MG/ML IJ SUSP
80.0000 mg | INTRAMUSCULAR | Status: AC | PRN
  Administered 2017-11-29: 80 mg

## 2017-11-29 NOTE — Progress Notes (Signed)
Office Visit Note   Patient: Shelia Gibbs           Date of Birth: [birth year]           MRN: [id number] Visit Date: 11/29/2017              Requested by: Carolan Clines, PA [address] [place], [place] [address] PCP: Carolan Clines, [place]   Assessment & Plan: Visit Diagnoses:  1. Impingement syndrome of left shoulder   2. Adhesive capsulitis of left shoulder   3. Arthritis of left acromioclavicular joint     Plan:  #1: Corticosteroid injection to the left shoulder subacromially.  Post injection she had near full range of motion and could actually get her hand behind her back. #2: She will take it easy over the next several days to allow the cortisone to work.   Follow-Up Instructions: Return in about 4 weeks (around 12/27/2017).  She may cancel if she is symptomatic.  Orders:  Orders Placed This Encounter  Procedures  . Large Joint Inj  . XR Shoulder Left   No orders of the defined types were placed in this encounter.     Procedures: Large Joint Inj: L subacromial bursa on 11/29/2017 3:06 PM Indications: pain and diagnostic evaluation Details: 25 G 1.5 in needle, anterolateral approach  Arthrogram: No  Medications: 2 mL lidocaine 2 %; 2 mL bupivacaine 0.5 %; 80 mg methylPREDNISolone acetate 40 MG/ML Consent was given by the patient. Immediately prior to procedure a time out was called to verify the correct patient, procedure, equipment, support staff and site/side marked as required. Patient was prepped and draped in the usual sterile fashion.       Clinical Data: No additional findings.   Subjective: Chief Complaint  Patient presents with  . Left Shoulder - Pain    Shelia Gibbs IS 57 Y O F HERER W LEFT SHOULDER PAIN FOR FEW YEARS, NO INJURY, NO XRAYS, NO INJECTIONS.    HPI  Shelia Gibbs is a very pleasant 57 year old African-American female who presents today with several day history of left shoulder pain.  She is had some pain over the last  several years but most recently though she is now unable to move the shoulder without severe pain.  She denies any history of injury or trauma.  It just started to occur.  Denies any cervical spine pain.  Review of Systems  Constitutional: Positive for fatigue. Negative for fever.  HENT: Negative for ear pain.   Eyes: Negative for pain.  Respiratory: Negative for cough and shortness of breath.   Cardiovascular: Negative for chest pain, palpitations and leg swelling.  Gastrointestinal: Negative for blood in stool, constipation and diarrhea.  Genitourinary: Negative for dysuria.  Musculoskeletal: Negative for back pain and neck pain.  Skin: Negative for rash and wound.  Allergic/Immunologic: Negative for food allergies.  Neurological: Positive for weakness and numbness. Negative for dizziness, light-headedness and headaches.  Hematological: Bruises/bleeds easily.  Psychiatric/Behavioral: Positive for sleep disturbance.     Objective: Vital Signs: BP (!) 114/58 (BP Location: Left Arm, Patient Position: Sitting, Cuff Size: Normal)   Pulse (!) 53   Resp 16   Ht 5\' 4"  (1.626 m)   Wt 183 lb (83 kg)   BMI 31.41 kg/m   Physical Exam  Constitutional: She is oriented to person, place, and time. She appears well-developed and well-nourished.  HENT:  Head: Normocephalic and atraumatic.  Eyes: EOM are normal. Pupils are equal, round, and  reactive to light.  Pulmonary/Chest: Effort normal.  Neurological: She is alert and oriented to person, place, and time.  Skin: Skin is warm and dry.  Psychiatric: She has a normal mood and affect. Her behavior is normal. Judgment and thought content normal.    Ortho Exam  Today she is markedly limited with range of motion of the shoulder.  About 45 degrees of abduction and passively with severe pain at the extremes.  Forward flexion to about 80 degrees with significant pain and discomfort.  Internal rotation with the arm at her side to her belly and  external rotation from neutral is only about 30 degrees.  She is neurovascular intact distally.  Postinjection she had near full range of motion of the left shoulder.  Marked decrease in pain.  Specialty Comments:  No specialty comments available.  Imaging: Xr Shoulder Left  Result Date: 11/29/2017 4 view x-ray of the left shoulder reveals joint space narrowing of the [facility] joint with inferior and superior spurring off of the distal clavicle.  Type II acromion    PMFS History: Current Outpatient Medications  Medication Sig Dispense Refill  . atomoxetine (STRATTERA) 60 MG capsule Take 60 mg by mouth daily.    . Cyanocobalamin (VITAMIN B-12 ER PO) Take by mouth.    . Nutritional Supplements (VITAMIN D MAINTENANCE PO) Take by mouth.    Marland Kitchen. amoxicillin-clavulanate (AUGMENTIN) 875-125 MG tablet Take 1 tablet by mouth 2 (two) times daily.    Marland Kitchen. estrogens, conjugated, (PREMARIN) 0.625 MG tablet Take 0.625 mg by mouth daily. Take daily for 21 days then do not take for 7 days.    Marland Kitchen. FLUoxetine (PROZAC) 20 MG tablet Take 20 mg by mouth daily.    . naproxen (NAPROSYN) 500 MG tablet Take 1 tablet (500 mg total) by mouth 2 (two) times daily. (Patient not taking: Reported on 11/29/2017) 30 tablet 0   No current facility-administered medications for this visit.     There are no active problems to display for this patient.  Past Medical History:  Diagnosis Date  . ADHD (attention deficit hyperactivity disorder)   . Atrial fibrillation (HCC)    [place]x-about 06[place]cannot remeber who  . Back pain   . Contact lens/glasses fitting    wears contacts or glasses  . Depression     Family History  Problem Relation Age of Onset  . Heart disease Mother   . Lung cancer Father     Past Surgical History:  Procedure Laterality Date  . ABDOMINAL HYSTERECTOMY  1998  . BREAST REDUCTION SURGERY Bilateral 06/23/2013   Procedure: MAMMARY REDUCTION  (BREAST);  Surgeon: Louisa SecondGerald Truesdale, MD;  Location:  [facility];  Service: Plastics;  Laterality: Bilateral;  . CARPAL TUNNEL RELEASE     rt and lt sept,may 2014  . CESAREAN SECTION     x2  . COSMETIC SURGERY     stomach  . DILATION AND CURETTAGE OF UTERUS    . ovary       rupture  . TUBAL LIGATION     Social History   Occupational History  . Not on file  Tobacco Use  . Smoking status: Never Smoker  . Smokeless tobacco: Never Used  Substance and Sexual Activity  . Alcohol use: No    Frequency: Never    Comment: occ  . Drug use: Yes    Types: Marijuana  . Sexual activity: Not on file

## 2017-12-17 ENCOUNTER — Ambulatory Visit (INDEPENDENT_AMBULATORY_CARE_PROVIDER_SITE_OTHER): Payer: Self-pay

## 2017-12-17 ENCOUNTER — Encounter (INDEPENDENT_AMBULATORY_CARE_PROVIDER_SITE_OTHER): Payer: Self-pay | Admitting: Orthopaedic Surgery

## 2017-12-17 ENCOUNTER — Ambulatory Visit (INDEPENDENT_AMBULATORY_CARE_PROVIDER_SITE_OTHER): Payer: BLUE CROSS/BLUE SHIELD | Admitting: Orthopaedic Surgery

## 2017-12-17 VITALS — BP 133/93 | HR 84 | Resp 16 | Ht 62.0 in | Wt 189.0 lb

## 2017-12-17 DIAGNOSIS — M545 Low back pain, unspecified: Secondary | ICD-10-CM

## 2017-12-17 MED ORDER — METHOCARBAMOL 500 MG PO TABS: ORAL_TABLET | ORAL | 0 refills | Status: AC

## 2017-12-17 NOTE — Patient Instructions (Signed)

## 2017-12-17 NOTE — Addendum Note (Signed)
Addended by: Thornell MuleVILLAREAL, Donnavin Vandenbrink R on: 12/17/2017 09:34 AM   Modules accepted: Orders

## 2017-12-17 NOTE — Progress Notes (Signed)
Office Visit Note   Patient: Shelia Gibbs           Date of Birth: [birth year]           MRN: [id number] Visit Date: 12/17/2017              Requested by: Carolan ClinesSnider, Gina M, PA [address] [place], [place] [id number] PCP: Carolan ClinesSnider, Gina M, [place]   Assessment & Plan: Visit Diagnoses:  1. Acute bilateral low back pain without sciatica     Plan: Acute on chronic low back pain.  No new radicular discomfort. will try lumbar support and muscle relaxant.  May continue with the NSAIDs.  Office 2 weeks  Follow-Up Instructions: No follow-ups on file.   Orders:  Orders Placed This Encounter  Procedures  . XR Lumbar Spine 2-3 Views   No orders of the defined types were placed in this encounter.     Procedures: No procedures performed   Clinical Data: No additional findings.   Subjective: Chief Complaint  Patient presents with  . Follow-up    few weeks ago turned to avoid door closing on her and her shoulder, 1 week afterwards she started having back painthat has gotten worse. would like injection.  Recently seen for evaluation of shoulder pain.  Cortisone injection made a difference". Shelia Gibbs relates acute onset of low back pain within the last week or 2 forcibly opening a door.  Pain is localized to her back without any radiation to either lower extremity.  Has had prior problems with her back.  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  Had epidural steroid injections with good results.  No bowel or bladder dysfunction  HPI  Review of Systems  Constitutional: Negative for fatigue and fever.  HENT: Negative for ear pain.   Eyes: Negative for pain.  Respiratory: Negative for cough and shortness of breath.   Cardiovascular: Negative for leg swelling.  Gastrointestinal: Negative for blood in stool, constipation and diarrhea.  Genitourinary: Negative for difficulty urinating.  Musculoskeletal: Positive for back pain. Negative for neck pain.   Skin: Negative for rash and wound.  Allergic/Immunologic: Negative for food allergies.  Neurological: Positive for headaches. Negative for dizziness, weakness, light-headedness and numbness.  Hematological: Does not bruise/bleed easily.  Psychiatric/Behavioral: Negative for sleep disturbance.     Objective: Vital Signs: BP (!) 133/93 (BP Location: Left Arm, Patient Position: Sitting, Cuff Size: Normal)   Pulse 84   Resp 16   Ht 5\' 2"  (1.575 m)   Wt 189 lb (85.7 kg)   BMI 34.57 kg/m   Physical Exam  Ortho Exam awake alert and oriented x3.  Comfortable sitting.  Straight leg raise negative bilaterally.  Diffuse percussible tenderness of lumbar spine.  Painless range of motion both hips.  Neurovascular exam intact.  No pain about either hip or thigh.  Specialty Comments:  No specialty comments available.  Imaging: No results found.   PMFS History: There are no active problems to display for this patient.  Past Medical History:  Diagnosis Date  . ADHD (attention deficit hyperactivity disorder)   . Arthritis   . Atrial fibrillation (HCC)    hx-about 06[place] remeber who  . Back pain   . Contact lens/glasses fitting    wears contacts or glasses  . Depression     Family History  Problem Relation Age of Onset  . Heart disease Mother   . Lung cancer Father  Past Surgical History:  Procedure Laterality Date  . ABDOMINAL HYSTERECTOMY  1998  . BREAST REDUCTION SURGERY Bilateral 06/23/2013   Procedure: MAMMARY REDUCTION  (BREAST);  Surgeon: Louisa Second, MD;  Location: [facility];  Service: Plastics;  Laterality: Bilateral;  . CARPAL TUNNEL RELEASE     rt and lt sept,may 2014  . CESAREAN SECTION     x2  . COSMETIC SURGERY     stomach  . DILATION AND CURETTAGE OF UTERUS    . ovary       rupture  . TUBAL LIGATION     Social History   Occupational History  . Not on file  Tobacco Use  . Smoking status: Never Smoker  .  Smokeless tobacco: Never Used  Substance and Sexual Activity  . Alcohol use: No    Frequency: Never    Comment: occ  . Drug use: Yes    Types: Marijuana  . Sexual activity: Not on file

## 2017-12-28 ENCOUNTER — Ambulatory Visit (INDEPENDENT_AMBULATORY_CARE_PROVIDER_SITE_OTHER): Payer: BLUE CROSS/BLUE SHIELD | Admitting: Orthopaedic Surgery

## 2017-12-28 ENCOUNTER — Encounter (INDEPENDENT_AMBULATORY_CARE_PROVIDER_SITE_OTHER): Payer: Self-pay | Admitting: Orthopaedic Surgery

## 2017-12-28 VITALS — BP 124/88 | HR 99 | Resp 18 | Ht 62.0 in

## 2017-12-28 DIAGNOSIS — G8929 Other chronic pain: Secondary | ICD-10-CM | POA: Diagnosis not present

## 2017-12-28 DIAGNOSIS — M545 Low back pain, unspecified: Secondary | ICD-10-CM

## 2017-12-28 NOTE — Progress Notes (Signed)
Office Visit Note   Patient: Shelia Gibbs           Date of Birth: Mar 31, 1961           MRN: [id number] Visit Date: 12/28/2017              Requested by: Carolan Clines, PA [address] [place], [place] [address] PCP: Carolan Clines, [place]   Assessment & Plan: Visit Diagnoses:  1. Chronic bilateral low back pain without sciatica     Plan: Much better with a combination of muscle relaxants, occasional NSAID in the back support.  Occasionally has some achiness in her right foot but no pain between her back and her right foot.  No symptoms on the left.  Long discussion regarding her diagnosis and need for exercise.  Use the above medicines on an as-needed basis.  Will return as needed  Follow-Up Instructions: Return if symptoms worsen or fail to improve.   Orders:  No orders of the defined types were placed in this encounter.  No orders of the defined types were placed in this encounter.     Procedures: No procedures performed   Clinical Data: No additional findings.   Subjective: Chief Complaint  Patient presents with  . Follow-up    LOW BACK PAIN DOING OK NO NUMBNESS,   Much better with a back support, occasional muscle relaxant and NSAID.  No bowel or bladder dysfunction.  No discomfort on the left.  Only an occasional discomfort in the area of the right buttock.  Has had occasional achiness in her right foot but no pain between her buttock ,back and her right foot.  HPI  Review of Systems  Constitutional: Negative for fatigue.  HENT: Negative for ear pain.   Eyes: Negative for pain.  Respiratory: Negative for cough and shortness of breath.   Cardiovascular: Negative for leg swelling.  Gastrointestinal: Negative for constipation and diarrhea.  Genitourinary: Negative for difficulty urinating.  Musculoskeletal: Positive for back pain. Negative for neck pain.  Skin: Positive for rash.  Allergic/Immunologic: Negative for food allergies.    Neurological: Negative for dizziness, weakness, numbness and headaches.  Hematological: Does not bruise/bleed easily.  Psychiatric/Behavioral: Negative for sleep disturbance.     Objective: Vital Signs: BP 124/88 (BP Location: Left Arm, Patient Position: Sitting, Cuff Size: Normal)   Pulse 99   Resp 18   Ht 5\' 2"  (1.575 m)   BMI 34.57 kg/m   Physical Exam  Ortho Exam awake alert and oriented x3.  Comfortable sitting A.  Able to cross her legs without any pain.  Straight leg raise negative.  Painless range of motion both hips.  Walks without a limp.  No percussible tenderness of the lumbar spine.  Specialty Comments:  No specialty comments available.  Imaging: No results found.   PMFS History: There are no active problems to display for this patient.  Past Medical History:  Diagnosis Date  . ADHD (attention deficit hyperactivity disorder)   . Arthritis   . Atrial fibrillation (HCC)    hx-about 06-[place] dr in [place] who  . Back pain   . Contact lens/glasses fitting    wears contacts or glasses  . Depression     Family History  Problem Relation Age of Onset  . Heart disease Mother   . Lung cancer Father     Past Surgical History:  Procedure Laterality Date  . ABDOMINAL HYSTERECTOMY  1998  . BREAST REDUCTION SURGERY Bilateral 06/23/2013  Procedure: MAMMARY REDUCTION  (BREAST);  Surgeon: Louisa SecondGerald Truesdale, MD;  Location: [facility];  Service: Plastics;  Laterality: Bilateral;  . CARPAL TUNNEL RELEASE     rt and lt sept,may 2014  . CESAREAN SECTION     x2  . COSMETIC SURGERY     stomach  . DILATION AND CURETTAGE OF UTERUS    . ovary       rupture  . TUBAL LIGATION     Social History   Occupational History  . Not on file  Tobacco Use  . Smoking status: Never Smoker  . Smokeless tobacco: Never Used  Substance and Sexual Activity  . Alcohol use: No    Frequency: Never    Comment: occ  . Drug use: Yes    Types: Marijuana   . Sexual activity: Not on file

## 2017-12-31 ENCOUNTER — Ambulatory Visit (INDEPENDENT_AMBULATORY_CARE_PROVIDER_SITE_OTHER): Payer: Medicare Other | Admitting: Orthopaedic Surgery

## 2018-01-22 ENCOUNTER — Encounter (INDEPENDENT_AMBULATORY_CARE_PROVIDER_SITE_OTHER): Payer: Self-pay | Admitting: Orthopaedic Surgery

## 2018-01-22 ENCOUNTER — Ambulatory Visit (INDEPENDENT_AMBULATORY_CARE_PROVIDER_SITE_OTHER): Payer: Medicare Other | Admitting: Orthopaedic Surgery

## 2018-01-22 ENCOUNTER — Ambulatory Visit (INDEPENDENT_AMBULATORY_CARE_PROVIDER_SITE_OTHER): Payer: BLUE CROSS/BLUE SHIELD

## 2018-01-22 ENCOUNTER — Ambulatory Visit (INDEPENDENT_AMBULATORY_CARE_PROVIDER_SITE_OTHER): Payer: BLUE CROSS/BLUE SHIELD | Admitting: Orthopaedic Surgery

## 2018-01-22 VITALS — BP 129/91 | HR 86 | Resp 18 | Ht 62.0 in | Wt 189.0 lb

## 2018-01-22 DIAGNOSIS — M7741 Metatarsalgia, right foot: Secondary | ICD-10-CM | POA: Diagnosis not present

## 2018-01-22 DIAGNOSIS — M25561 Pain in right knee: Secondary | ICD-10-CM

## 2018-01-22 DIAGNOSIS — G8929 Other chronic pain: Secondary | ICD-10-CM

## 2018-01-22 DIAGNOSIS — M79674 Pain in right toe(s): Secondary | ICD-10-CM

## 2018-01-22 MED ORDER — BUPIVACAINE HCL 0.5 % IJ SOLN
2.0000 mL | INTRAMUSCULAR | Status: AC | PRN
  Administered 2018-01-22: 2 mL via INTRA_ARTICULAR

## 2018-01-22 MED ORDER — METHYLPREDNISOLONE ACETATE 40 MG/ML IJ SUSP
80.0000 mg | INTRAMUSCULAR | Status: AC | PRN
  Administered 2018-01-22: 80 mg

## 2018-01-22 MED ORDER — LIDOCAINE HCL 1 % IJ SOLN
2.0000 mL | INTRAMUSCULAR | Status: AC | PRN
Start: 2018-01-22 — End: 2018-01-22
  Administered 2018-01-22: 2 mL

## 2018-01-22 NOTE — Progress Notes (Signed)
Office Visit Note   Patient: Shelia Gibbs           Date of Birth: 04/24/1961           MRN: [id number] Visit Date: 01/22/2018              Requested by: Carolan Clines, PA [address] [place], [place] [address] PCP: Carolan Clines, [place]   Assessment & Plan: Visit Diagnoses:  1. Chronic pain of right knee   2. Pain of right great toe   3. Metatarsalgia, right foot     Plan:  #1: I have injected the right knee with a good results. 2: I have given her a metatarsal pad for her shoes which she felt was beneficial. #3: If she continues to have problems we certainly need to obtain an MRI scan of the knee.  Follow-Up Instructions: Return if symptoms worsen or fail to improve.   Face-to-face time spent with patient was greater than 30 minutes.  Greater than 50% of the time was spent in counseling and coordination of care.  Orders:  Orders Placed This Encounter  Procedures  . XR KNEE 3 VIEW RIGHT  . XR Toe Great Right   No orders of the defined types were placed in this encounter.     Procedures: Large Joint Inj: R knee on 01/22/2018 1:01 PM Indications: pain and diagnostic evaluation Details: 25 G 1.5 in needle, anteromedial approach  Arthrogram: No  Medications: 2 mL lidocaine 1 %; 2 mL bupivacaine 0.5 %; 80 mg methylPREDNISolone acetate 40 MG/ML Procedure, treatment alternatives, risks and benefits explained, specific risks discussed. Consent was given by the patient. Immediately prior to procedure a time out was called to verify the correct patient, procedure, equipment, support staff and site/side marked as required. Patient was prepped and draped in the usual sterile fashion.       Clinical Data: No additional findings.   Subjective: Chief Complaint  Patient presents with  . Right Knee - Pain  . Follow-up    r knee pain for about 1 yr getting worse,has swelling  no injury. had a cyst behind knee 03/2017 but went away. also r great toe pain on  and off for 1 yr.    HPI  Shelia Gibbs is a 57 year old African-American female who is seen today for 2 problems.  One is her right knee where over the past year she has had knee pain and is apparently getting worse.  She had been previously diagnosed back in July 2018 had a Baker's cyst but apparently that improved.  Pain discomfort in the knee especially with ambulation.  She does have a little bit of a catching feeling.  Denies any locking or giving way.  Denies any recent history of injury or trauma.  She is also having problems with great toe pain off and on for the past year.  It is mainly on the first MTP joint.  She does not remember any history of injury or trauma.  Is not really sought any treatment for this.  Review of Systems  Constitutional: Negative for fatigue and fever.  HENT: Negative for ear pain.   Eyes: Negative for pain.  Respiratory: Negative for cough and shortness of breath.   Cardiovascular: Positive for leg swelling.  Gastrointestinal: Negative for constipation and diarrhea.  Genitourinary: Negative for difficulty urinating.  Musculoskeletal: Positive for back pain. Negative for neck pain.  Skin: Positive for rash.  Allergic/Immunologic: Negative for food allergies.  Neurological:  Positive for weakness. Negative for numbness.  Hematological: Bruises/bleeds easily.  Psychiatric/Behavioral: Negative for sleep disturbance.     Objective: Vital Signs: BP (!) 129/91 (BP Location: Left Arm, Patient Position: Sitting, Cuff Size: Normal)   Pulse 86   Resp 18   Ht  (1.575 m)   Wt 189 lb (85.7 kg)   BMI 34.57 kg/m   Physical Exam  Constitutional: She is oriented to person, place, and time. She appears well-developed and well-nourished.  HENT:  Mouth/Throat: Oropharynx is clear and moist.  Eyes: Pupils are equal, round, and reactive to light. EOM are normal.  Pulmonary/Chest: Effort normal.  Neurological: She is alert and oriented to person, place, and time.    Skin: Skin is warm and dry.  Psychiatric: She has a normal mood and affect. Her behavior is normal.    Ortho Exam  Exam today reveals good motion of the right knee from near full extension to 115 degrees.  Does have mild effusion.  No warmth or erythema.  Tender along the medial joint line.  Not much laterally.  She does have a little bit of crepitance patellofemoral.  No pain with internal extra rotation of the hip.  Calf is supple nontender.  Neurovascular intact distally.  Specialty Comments:  No specialty comments available.  Imaging: Xr Toe Great Right  Result Date: 01/22/2018 Three-view x-ray of the right periarticular spurring more medial on the metatarsal head.  There is also some changes of spurring of the distal phalanx both the medial and lateral.  Joint spaces are maintained at the IP joint.  There is some narrowing at the MTP joint.  Sesamoids appear intact.  There is some cystic changes at the lateral aspect of the distal first metatarsal.  Great toe reveals some spurring at the distal phalanx.    Xr Knee 3 View Right  Result Date: 01/22/2018 Three-view x-ray of the right knee reveals on the lateral some femoral condyle.  She does have a superior pole of patella spurring.  She is maintaining good joint spaces on the AP.  She does have some mild peaking of the intercondylar spines.    PMFS History: There are no active problems to display for this patient.  Past Medical History:  Diagnosis Date  . ADHD (attention deficit hyperactivity disorder)   . Arthritis   . Atrial fibrillation (HCC)    hx-about 06-saw dr in [place] rem[place] who  . Back pain   . Contact lens/glasses fitting    wears contacts or glasses  . Depression     Family History  Problem Relation Age of Onset  . Heart disease Mother   . Lung cancer Father     Past Surgical History:  Procedure Laterality Date  . ABDOMINAL HYSTERECTOMY  1998  . BREAST REDUCTION SURGERY Bilateral 06/23/2013    Procedure: MAMMARY REDUCTION  (BREAST);  Surgeon: Louisa Second, MD;  Location: [facility];  Service: Plastics;  Laterality: Bilateral;  . CARPAL TUNNEL RELEASE     rt and lt sept,may 2014  . CESAREAN SECTION     x2  . COSMETIC SURGERY     stomach  . DILATION AND CURETTAGE OF UTERUS    . ovary       rupture  . TUBAL LIGATION     Social History   Occupational History  . Not on file  Tobacco Use  . Smoking status: Never Smoker  . Smokeless tobacco: Never Used  Substance and Sexual Activity  . Alcohol use:  Not Currently    Frequency: Never  . Drug use: Yes    Types: Marijuana  . Sexual activity: Not on file

## 2018-02-26 ENCOUNTER — Telehealth (INDEPENDENT_AMBULATORY_CARE_PROVIDER_SITE_OTHER): Payer: Self-pay | Admitting: Orthopaedic Surgery

## 2018-02-26 NOTE — Telephone Encounter (Signed)
Need more detail for note

## 2018-02-26 NOTE — Telephone Encounter (Signed)
Please advise 

## 2018-02-26 NOTE — Telephone Encounter (Signed)
Patient calling concerning here knee arthritis. Per patient, she has taken a job that requires her to be standing for 8 hours. Patient is unable to due this. Patient requesting a note stating this with her diagnosis. Please call to inform.

## 2018-02-26 NOTE — Telephone Encounter (Signed)
Called pt who stated she dose not need note from us because she got it from her PCP.

## 2018-07-11 ENCOUNTER — Telehealth (INDEPENDENT_AMBULATORY_CARE_PROVIDER_SITE_OTHER): Payer: Self-pay | Admitting: Orthopaedic Surgery

## 2018-07-11 NOTE — Telephone Encounter (Signed)
Patient requests a copy of her lt hip xray before Monday, if possible.  Please call patient when ready to pick up.

## 2018-07-11 NOTE — Telephone Encounter (Signed)
I called patient, no x-rays of left hip performed

## 2018-07-18 ENCOUNTER — Ambulatory Visit (INDEPENDENT_AMBULATORY_CARE_PROVIDER_SITE_OTHER): Payer: Self-pay

## 2018-07-18 ENCOUNTER — Ambulatory Visit (INDEPENDENT_AMBULATORY_CARE_PROVIDER_SITE_OTHER): Payer: BLUE CROSS/BLUE SHIELD | Admitting: Orthopaedic Surgery

## 2018-07-18 ENCOUNTER — Encounter (INDEPENDENT_AMBULATORY_CARE_PROVIDER_SITE_OTHER): Payer: Self-pay | Admitting: Orthopaedic Surgery

## 2018-07-18 VITALS — BP 134/93 | HR 101 | Ht 62.0 in | Wt 207.0 lb

## 2018-07-18 DIAGNOSIS — M25552 Pain in left hip: Secondary | ICD-10-CM | POA: Diagnosis not present

## 2018-07-18 NOTE — Progress Notes (Signed)
Office Visit Note   Patient: Shelia Gibbs           Date of Birth: 1960/11/23           MRN: [id number] Visit Date: 07/18/2018              Requested by: Carolan Clines, PA [address] [place], [place] [address] PCP: Carolan Clines, [place]   Assessment & Plan: Visit Diagnoses:  1. Pain in left hip     Plan: Pain along the lateral aspect of her left hemipelvis possibly referred from lumbar spine.  Has prior diagnosis of degenerative lumbar disc disease.  Reassured patient as she is having bariatric surgery in the next month.  Okay for Tylenol.  Follow-Up Instructions: Return if symptoms worsen or fail to improve.   Orders:  Orders Placed This Encounter  Procedures  . XR HIP UNILAT W OR W/O PELVIS 2-3 VIEWS LEFT   No orders of the defined types were placed in this encounter.     Procedures: No procedures performed   Clinical Data: No additional findings.   Subjective: Chief Complaint  Patient presents with  . Follow-up    L HIP PAIN 8 YRS GETTING WORSE, NO INJURY HAD L HIP INJECTION 7-10 YRS AGO   Shelia Gibbs has been experiencing some pain along the lateral aspect of her right hemipelvis.  She does have a history of arthritis of the lumbar spine and feels like it might be referred from her back.  She is also had some discomfort over the proximal greater trochanter.  No groin pain.  No numbness or tingling.  She is anticipating bariatric surgery within the next month and want to be sure that there was not a problem  HPI  Review of Systems  Constitutional: Negative for fatigue and fever.  HENT: Negative for ear pain.   Eyes: Negative for pain.  Respiratory: Negative for cough and shortness of breath.   Cardiovascular: Negative for leg swelling.  Gastrointestinal: Negative for constipation and diarrhea.  Genitourinary: Negative for difficulty urinating.  Musculoskeletal: Positive for back pain. Negative for neck pain.  Skin: Negative for rash.    Allergic/Immunologic: Negative for food allergies.  Neurological: Positive for weakness and numbness.  Hematological: Bruises/bleeds easily.  Psychiatric/Behavioral: Positive for sleep disturbance.     Objective: Vital Signs: BP (!) 134/93 (BP Location: Left Arm, Patient Position: Sitting, Cuff Size: Normal)   Pulse (!) 101   Ht 5\' 2"  (1.575 m)   Wt 207 lb (93.9 kg)   BMI 37.86 kg/m   Physical Exam  Constitutional: She is oriented to person, place, and time. She appears well-developed and well-nourished.  HENT:  Mouth/Throat: Oropharynx is clear and moist.  Eyes: Pupils are equal, round, and reactive to light. EOM are normal.  Pulmonary/Chest: Effort normal.  Neurological: She is alert and oriented to person, place, and time.  Skin: Skin is warm and dry.  Psychiatric: She has a normal mood and affect. Her behavior is normal.    Ortho Exam awake alert and oriented x3.  Comfortable sitting.  Painless range of motion of left hip with internal/external rotation.  No specific pain over the greater trochanter.  No thigh pain.  No knee pain.  No calf pain.  Straight leg raise negative.  Some mild percussible tenderness to the left of the lumbar spine.  No pain over the sacroiliac joint.  Skin intact  Specialty Comments:  No specialty comments available.  Imaging: Xr Hip  Unilat W Or W/o Pelvis 2-3 Views Left  Result Date: 07/18/2018 AP of the pelvis and lateral left hip are obtained without evidence of any abnormality about the left hip.  Greater trochanter was clear of any ectopic calcification.  There is some wispy calcification at the tip of the iliac crest bilaterally degenerative changes at L5-S1 on the limited view    PMFS History: There are no active problems to display for this patient.  Past Medical History:  Diagnosis Date  . ADHD (attention deficit hyperactivity disorder)   . Arthritis   . Atrial fibrillation (HCC)    hx-about 06-saw dr in [place]  who  . Back pain   . Contact lens/glasses fitting    wears contacts or glasses  . Depression     Family History  Problem Relation Age of Onset  . Heart disease Mother   . Lung cancer Father     Past Surgical History:  Procedure Laterality Date  . ABDOMINAL HYSTERECTOMY  1998  . BREAST REDUCTION SURGERY Bilateral 06/23/2013   Procedure: MAMMARY REDUCTION  (BREAST);  Surgeon: Louisa Second, MD;  Location: [facility];  Service: Plastics;  Laterality: Bilateral;  . CARPAL TUNNEL RELEASE     rt and lt sept,may 2014  . CESAREAN SECTION     x2  . COSMETIC SURGERY     stomach  . DILATION AND CURETTAGE OF UTERUS    . HERNIA REPAIR    . ovary       rupture  . TUBAL LIGATION    . UPPER GI ENDOSCOPY     Social History   Occupational History  . Not on file  Tobacco Use  . Smoking status: Never Smoker  . Smokeless tobacco: Never Used  Substance and Sexual Activity  . Alcohol use: Not Currently    Frequency: Never  . Drug use: Yes    Types: Marijuana  . Sexual activity: Not on file

## 2019-03-26 IMAGING — US VENOUS LEG
1 series · 13 of 24 positions shown · non-contrast
Comparison: 02/26/2009

CLINICAL DATA: Right lower extremity pain and swelling for 2 weeks.
Foot burning.



[Series 1: venous leg · 0.07mm/px · 13 of 38 slices shown]
[im 1/38]
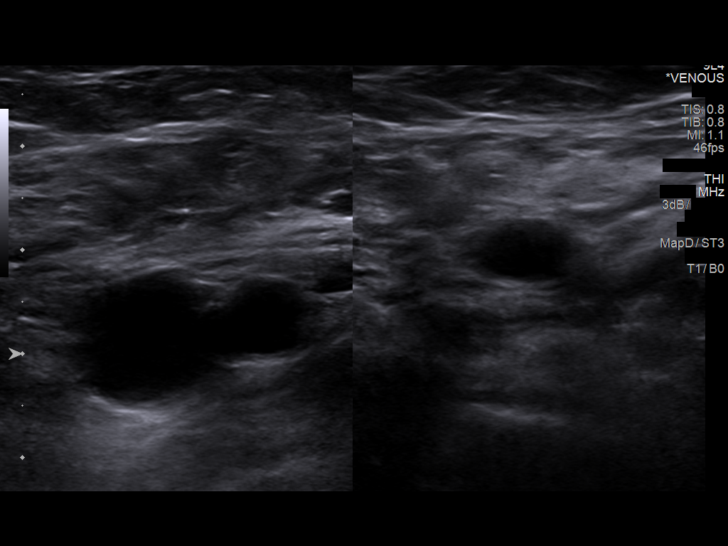
[im 4/38]
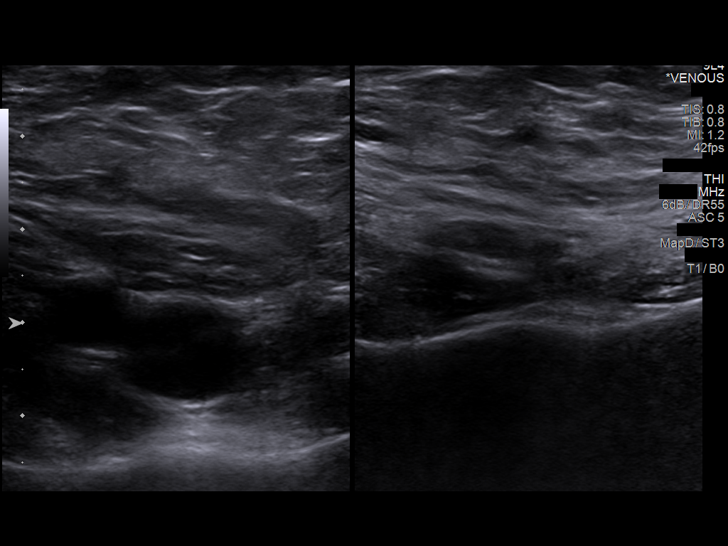
[im 7/38]
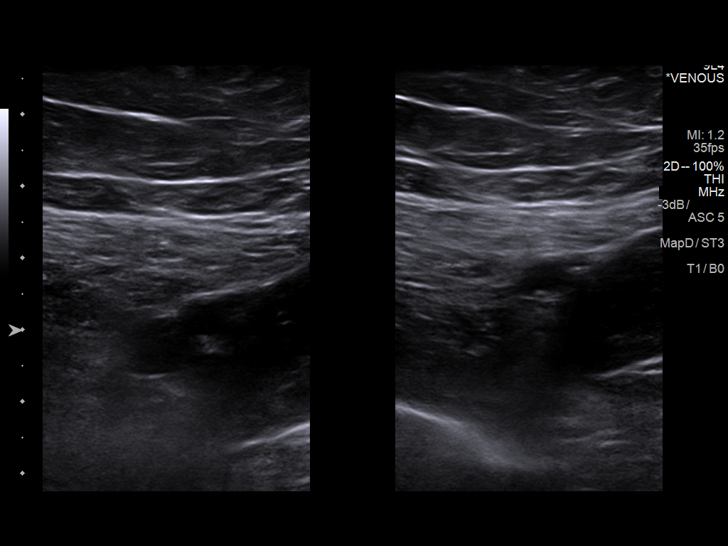
[im 10/38]
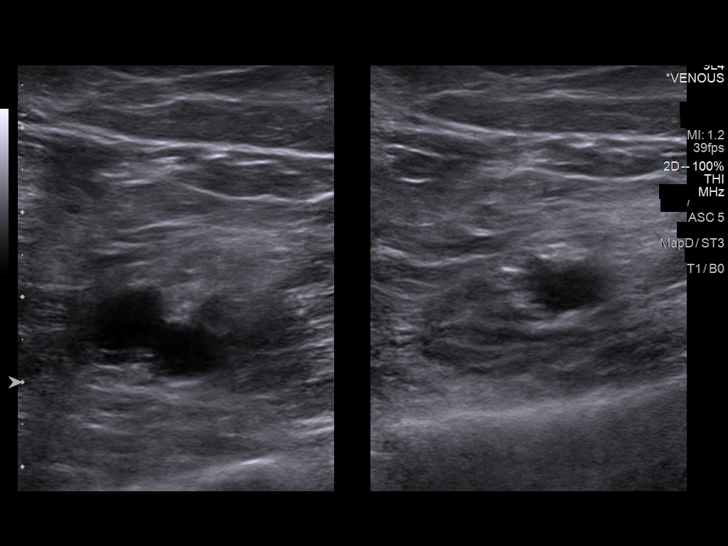
[im 13/38]
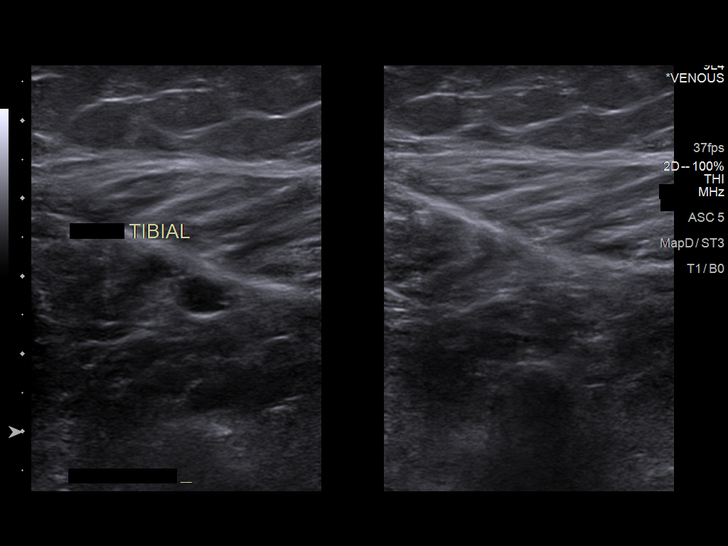
[im 17/38]
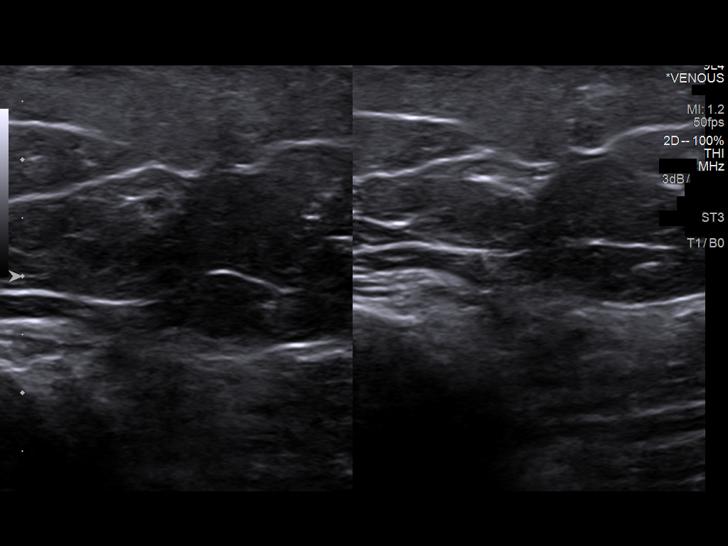
[im 20/38]
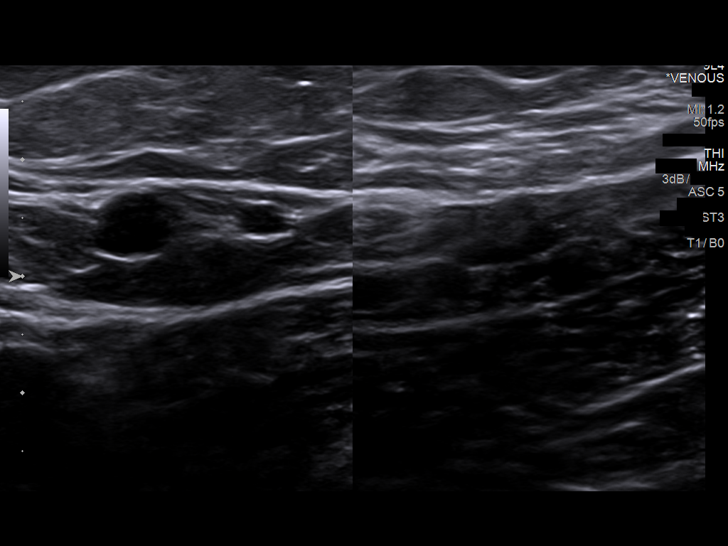
[im 21/38]
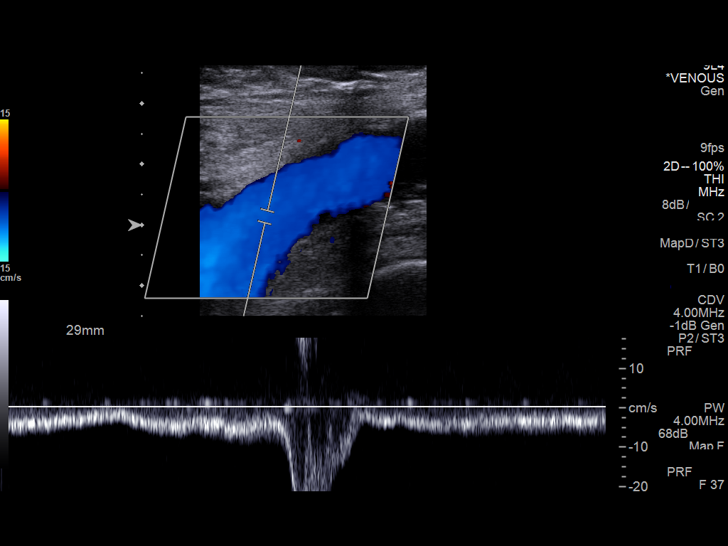
[im 25/38]
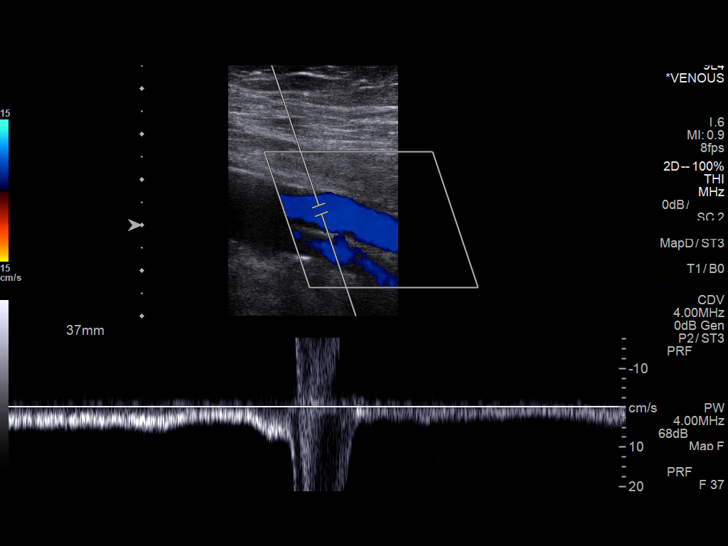
[im 28/38]
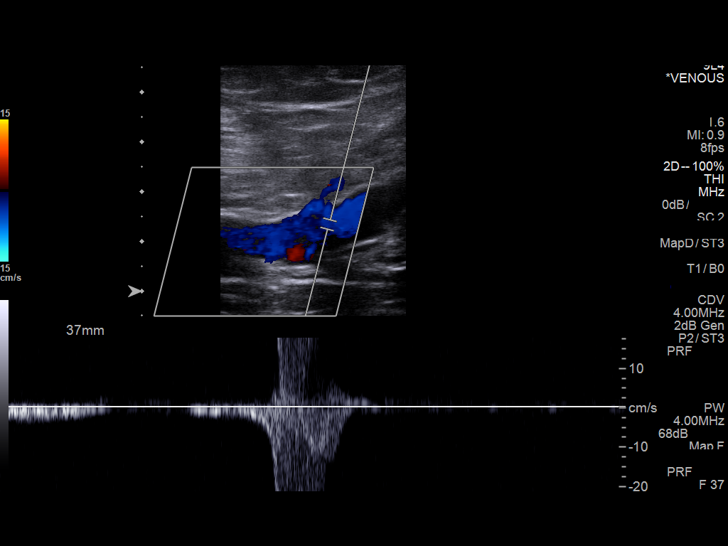
[im 31/38]
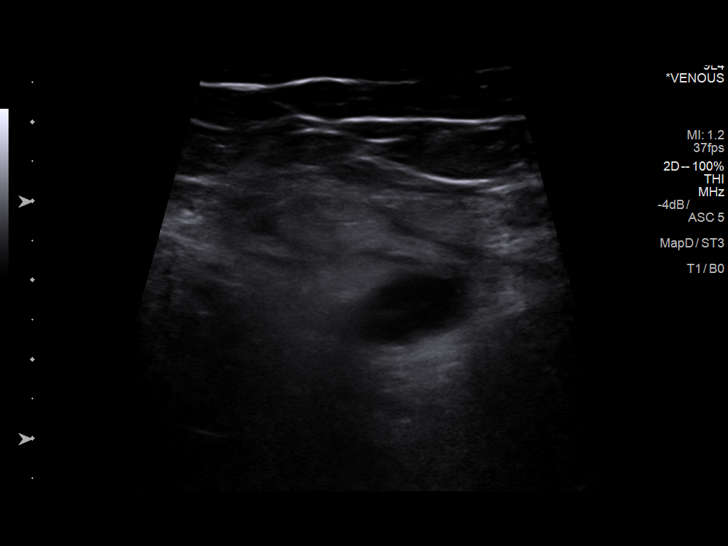
[im 34/38]
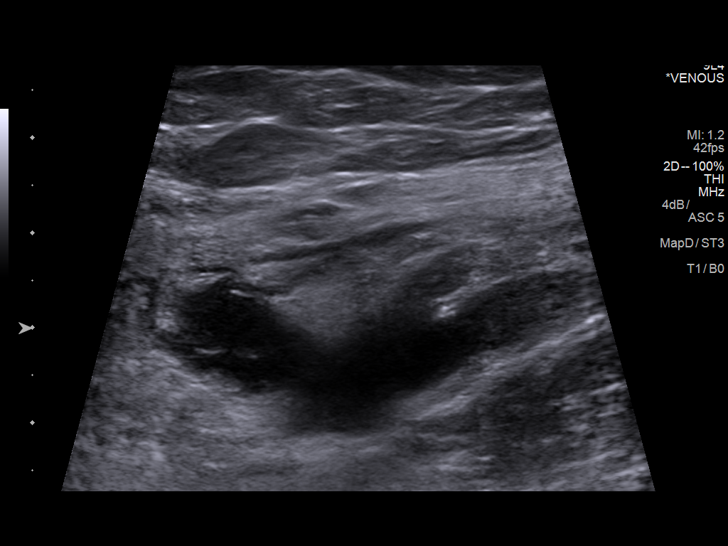
[im 38/38]
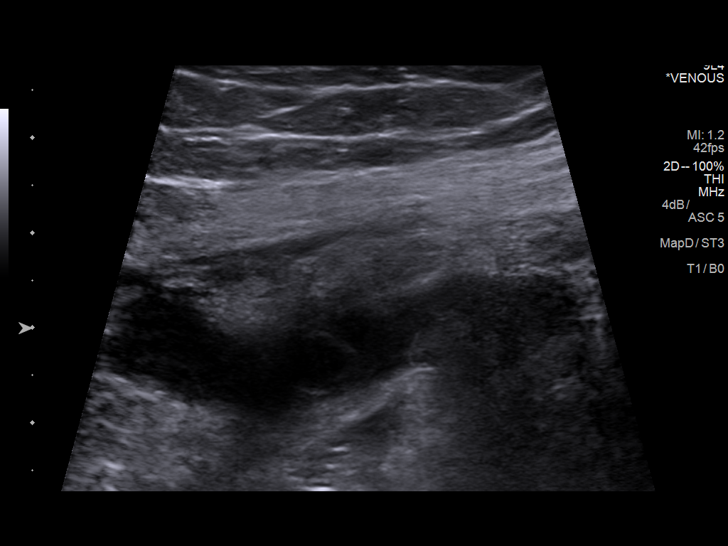

[13 of 24 positions shown; findings below may reference images not displayed]

FINDINGS: Contralateral Common Femoral Vein: Respiratory phasicity is normal
and symmetric with the symptomatic side. No evidence of thrombus.
Normal compressibility.

Common Femoral Vein: No evidence of thrombus.

Saphenofemoral Junction: No evidence of thrombus.

Profunda Femoral Vein: No evidence of thrombus.

Femoral Vein: No evidence of thrombus.

Popliteal Vein: No evidence of thrombus.

Calf Veins: No evidence of thrombus.

Superficial Great Saphenous Vein: No evidence of thrombus.

Other Findings: 3.3 x 1.6 x 1 cm avascular hypoechoic collection in
the medial popliteal fossa. Joint communication was not clearly
demonstrated.
IMPRESSION: 1. No evidence of DVT within the right lower extremity.
2. 3 x 1.6 cm collection in the popliteal fossa, location favoring
Baker cyst.

## 2022-02-24 ENCOUNTER — Ambulatory Visit: Payer: Medicare Other | Admitting: Physician Assistant

## 2022-04-07 ENCOUNTER — Ambulatory Visit: Payer: Medicare Other | Admitting: Physician Assistant

## 2022-04-07 ENCOUNTER — Ambulatory Visit: Payer: Self-pay

## 2022-04-07 ENCOUNTER — Ambulatory Visit (INDEPENDENT_AMBULATORY_CARE_PROVIDER_SITE_OTHER): Payer: BC Managed Care – PPO | Admitting: Physician Assistant

## 2022-04-07 DIAGNOSIS — G8929 Other chronic pain: Secondary | ICD-10-CM | POA: Diagnosis not present

## 2022-04-07 DIAGNOSIS — M25511 Pain in right shoulder: Secondary | ICD-10-CM

## 2022-04-07 DIAGNOSIS — M79644 Pain in right finger(s): Secondary | ICD-10-CM | POA: Diagnosis not present

## 2022-04-07 MED ORDER — METHYLPREDNISOLONE ACETATE 40 MG/ML IJ SUSP
20.0000 mg | INTRAMUSCULAR | Status: AC | PRN
  Administered 2022-04-07: 20 mg

## 2022-04-07 MED ORDER — LIDOCAINE HCL 1 % IJ SOLN
2.0000 mL | INTRAMUSCULAR | Status: AC | PRN
  Administered 2022-04-07: 2 mL

## 2022-04-07 MED ORDER — METHYLPREDNISOLONE ACETATE 40 MG/ML IJ SUSP
80.0000 mg | INTRAMUSCULAR | Status: AC | PRN
  Administered 2022-04-07: 80 mg via INTRA_ARTICULAR

## 2022-04-07 MED ORDER — BUPIVACAINE HCL 0.25 % IJ SOLN
2.0000 mL | INTRAMUSCULAR | Status: AC | PRN
  Administered 2022-04-07: 2 mL via INTRA_ARTICULAR

## 2022-04-07 MED ORDER — LIDOCAINE HCL 1 % IJ SOLN
0.5000 mL | INTRAMUSCULAR | Status: AC | PRN
  Administered 2022-04-07: .5 mL

## 2022-04-07 NOTE — Progress Notes (Addendum)
Office Visit Note   Patient: Shelia Gibbs           Date of Birth: November 24, 1960           MRN: [id number] Visit Date: 04/07/2022              Requested by: Carolan Clines, PA [address] [place],  [place] [address] PCP: Carolan Clines, [place]  Chief Complaint  Patient presents with  . Right Shoulder - Pain      HPI: Patient is a pleasant 61 year old woman who comes in for 2 concerns today.  1 she has had a 62-month history of right shoulder pain.  Denies any specific injury but states that she underwent some surgery and had to sleep on her right side a lot.  She thinks this may be the cause.  Denies any neck pain any paresthesias numbing or tingling. Also complaining of pain at the base of the short finger of the right hand.  Again no injury.  She does use his hand quite a bit.  She has had no fever or chills  Assessment & Plan: Visit Diagnoses:  1. Chronic right shoulder pain   Right small finger pain  Plan: Right shoulder impingement.  She does have some hypertrophic bone around the [facility] joint and slight sloping of the acromion.  She has had good success with injections into the left shoulder.  We discussed doing an injection into the right shoulder she like to go through with that today with cortisone.  She will follow-up in 3 weeks with Dr. Cleophas Dunker for the results.  Of course if she is doing well she may cancel this appointment. With regards to the fifth finger I think she might have a trigger finger seem as she is very tender.  No redness or cellulitis.  We will go forward with an injection today as well.  She did get some relief from both injections prior to leaving the office  Follow-Up Instructions:   Ortho Exam  Patient is alert, oriented, no adenopathy, well-dressed, normal affect, normal respiratory effort. Right shoulder: No redness no erythema.  She does have good motion however has pain with forward elevation of her shoulder.  Pain with internal rotation  behind her back and she has positive impingement findings.  Grip strength is good she has normal sensation to her hand.  No neck pain.  Negative empty can test but she does have some positive impingement findings with cross body internal rotation.  Strength is intact. Examination of her right hand no redness no cellulitis she has a good strong pulse and brisk capillary refill she is focally tender at the base of the fifth MCP joint.  Cannot appreciate triggering Imaging: No results found. No images are attached to the encounter.  Labs: Lab Results  Component Value Date   ESRSEDRATE 17 05/02/2016     Lab Results  Component Value Date   ALBUMIN 3.7 03/04/2013   ALBUMIN 3.4 (L) 05/25/2012   ALBUMIN 3.6 04/29/2008    No results found for: "MG" No results found for: "VD25OH"  No results found for: "PREALBUMIN"    Latest Ref Rng & Units 06/23/2013    7:22 AM 03/04/2013    4:00 PM 05/25/2012   11:40 AM  CBC EXTENDED  WBC 4.0 - 10.5 K/uL  5.8  5.1   RBC 3.87 - 5.11 MIL/uL  4.33  4.38   Hemoglobin 12.0 - 15.0 g/dL 78.9  38.1  12.9  HCT 36.0 - 46.0 %  38.3  39.1   Platelets 150 - 400 K/uL  183  200   NEUT# 1.7 - 7.7 K/uL  2.5  2.4   Lymph# 0.7 - 4.0 K/uL  2.9  2.0      There is no height or weight on file to calculate BMI.  Orders:  Orders Placed This Encounter  Procedures  . XR Shoulder Right   No orders of the defined types were placed in this encounter.    Procedures: Large Joint Inj: R subacromial bursa on 04/07/2022 11:33 AM Indications: diagnostic evaluation and pain Details: 25 G 1.5 in needle, posterior approach  Arthrogram: No  Medications: 2 mL lidocaine 1 %; 80 mg methylPREDNISolone acetate 40 MG/ML; 2 mL bupivacaine 0.25 % Outcome: tolerated well, no immediate complications Procedure, treatment alternatives, risks and benefits explained, specific risks discussed. Consent was given by the patient.    Hand/UE Inj for trigger finger on 04/07/2022 11:36  AM Indications: diagnostic and therapeutic Details: 25 G needle, volar approach Medications: 0.5 mL lidocaine 1 %; 20 mg methylPREDNISolone acetate 40 MG/ML Outcome: tolerated well, no immediate complications Procedure, treatment alternatives, risks and benefits explained, specific risks discussed. Consent was given by the patient.    Clinical Data: No additional findings.  ROS:  All other systems negative, except as noted in the HPI. Review of Systems  Objective: Vital Signs: There were no vitals taken for this visit.  Specialty Comments:  No specialty comments available.  PMFS History: There are no problems to display for this patient.  Past Medical History:  Diagnosis Date  . ADHD (attention deficit hyperactivity disorder)   . Arthritis   . Atrial fibrillation (HCC)    hx-about 06-saw dr in [place] rem[place] who  . Back pain   . Contact lens/glasses fitting    wears contacts or glasses  . Depression     Family History  Problem Relation Age of Onset  . Heart disease Mother   . Lung cancer Father     Past Surgical History:  Procedure Laterality Date  . ABDOMINAL HYSTERECTOMY  1998  . BREAST REDUCTION SURGERY Bilateral 06/23/2013   Procedure: MAMMARY REDUCTION  (BREAST);  Surgeon: Louisa Second, MD;  Location: [facility];  Service: Plastics;  Laterality: Bilateral;  . CARPAL TUNNEL RELEASE     rt and lt sept,may 2014  . CESAREAN SECTION     x2  . COSMETIC SURGERY     stomach  . DILATION AND CURETTAGE OF UTERUS    . HERNIA REPAIR    . ovary       rupture  . TUBAL LIGATION    . UPPER GI ENDOSCOPY     Social History   Occupational History  . Not on file  Tobacco Use  . Smoking status: Never  . Smokeless tobacco: Never  Vaping Use  . Vaping Use: Never used  Substance and Sexual Activity  . Alcohol use: Not Currently  . Drug use: Yes    Types: Marijuana  . Sexual activity: Not on file

## 2022-04-19 ENCOUNTER — Ambulatory Visit: Payer: BC Managed Care – PPO | Admitting: Orthopaedic Surgery

## 2022-04-19 ENCOUNTER — Encounter: Payer: Self-pay | Admitting: Orthopaedic Surgery

## 2022-04-19 DIAGNOSIS — M7541 Impingement syndrome of right shoulder: Secondary | ICD-10-CM

## 2022-04-19 DIAGNOSIS — M65351 Trigger finger, right little finger: Secondary | ICD-10-CM

## 2022-04-19 NOTE — Progress Notes (Deleted)
   Office Visit Note   Patient: Shelia Gibbs           Date of Birth: Apr 06, 1961           MRN: [id number] Visit Date: 04/19/2022              Requested by: Carolan Clines, PA [address] [place],  [place] [address] PCP: Carolan Clines, [place]   Assessment & Plan: Visit Diagnoses: No diagnosis found.  Plan: ***  Follow-Up Instructions: No follow-ups on file.   Orders:  No orders of the defined types were placed in this encounter.  No orders of the defined types were placed in this encounter.     Procedures: No procedures performed   Clinical Data: No additional findings.   Subjective: Chief Complaint  Patient presents with   Right Shoulder - Follow-up   Patient presents today for follow up of her right shoulder and her right hand. She states that since last injection received on 04/07/2022 in her shoulder and hand she has ben doing so much better. Reports that she is currently not taking anything for pain at this time.   Review of Systems   Objective: Vital Signs: There were no vitals taken for this visit.  Physical Exam  Ortho Exam  Specialty Comments:  No specialty comments available.  Imaging: No results found.   PMFS History: There are no problems to display for this patient.  Past Medical History:  Diagnosis Date   ADHD (attention deficit hyperactivity disorder)    Arthritis    Atrial fibrillation (HCC)    hx-about 06-saw dr in [place] rem[place] who   Back pain    Contact lens/glasses fitting    wears contacts or glasses   Depression     Family History  Problem Relation Age of Onset   Heart disease Mother    Lung cancer Father     Past Surgical History:  Procedure Laterality Date   ABDOMINAL HYSTERECTOMY  1998   BREAST REDUCTION SURGERY Bilateral 06/23/2013   Procedure: MAMMARY REDUCTION  (BREAST);  Surgeon: Louisa Second, MD;  Location: [facility];  Service: Plastics;  Laterality: Bilateral;    CARPAL TUNNEL RELEASE     rt and lt sept,may 2014   CESAREAN SECTION     x2   COSMETIC SURGERY     stomach   DILATION AND CURETTAGE OF UTERUS     HERNIA REPAIR     ovary       rupture   TUBAL LIGATION     UPPER GI ENDOSCOPY     Social History   Occupational History   Not on file  Tobacco Use   Smoking status: Never   Smokeless tobacco: Never  Vaping Use   Vaping Use: Never used  Substance and Sexual Activity   Alcohol use: Not Currently   Drug use: Yes    Types: Marijuana   Sexual activity: Not on file

## 2022-04-19 NOTE — Progress Notes (Signed)
Office Visit Note   Patient: Shelia Gibbs           Date of Birth: 1961-04-27           MRN: [id number] Visit Date: 04/19/2022              Requested by: Carolan Clines, PA [address] [place],  [place] [address] PCP: Carolan Clines, [place]   Assessment & Plan: Visit Diagnoses:  1. Trigger finger, right little finger   2. Impingement syndrome of right shoulder     Plan: Ms.Ghazarian was seen several weeks ago for evaluation of right shoulder pain consistent with impingement syndrome and triggering of the right little finger.  [place] injected the shoulder and the trigger finger and she relates that she is doing very well no longer has any problems.  She could easily raise her right arm over her head and scratch the middle of her back without any hesitation or discomfort.  She no longer has any triggering of the little finger.  Long discussion regarding what may happen over time if she has any recurrence of her shoulder pain obtain an MRI scan.  We can always consider surgery to release the trigger finger or repeat the injection depending upon how quickly it should recur if ever.  We will plan to see her back as needed. Had gastric bypass surgery in 2019 is lost about 84 pounds and notes that she has had a chronically low calcium.  We will follow-up with her primary care physician for bone density study  Follow-Up Instructions: Return if symptoms worsen or fail to improve.   Orders:  No orders of the defined types were placed in this encounter.  No orders of the defined types were placed in this encounter.     Procedures: No procedures performed   Clinical Data: No additional findings.   Subjective: Chief Complaint  Patient presents with   Right Shoulder - Follow-up  Several weeks status post evaluation for impingement syndrome right shoulder and triggering of the right little finger.  Had cortisone injections to both places and relates she is doing quite well.   No numbness or tingling or significant pain or limitation of function  HPI  Review of Systems   Objective: Vital Signs: There were no vitals taken for this visit.  Physical Exam Constitutional:      Appearance: She is well-developed.  Eyes:     Pupils: Pupils are equal, round, and reactive to light.  Pulmonary:     Effort: Pulmonary effort is normal.  Skin:    General: Skin is warm and dry.  Neurological:     Mental Status: She is alert and oriented to person, place, and time.  Psychiatric:        Behavior: Behavior normal.     Ortho Exam awake alert and oriented x3.  Comfortable sitting.  Right shoulder with full overhead motion and internal/external rotation.  Negative impingement and empty can testing.  No localized areas of tenderness about the lateral or anterior aspect of the shoulder.  Good strength.  Biceps intact.  No pain about the right little finger.  No active triggering or any pain over the A1 pulley  Specialty Comments:  No specialty comments available.  Imaging: No results found.   PMFS History: Patient Active Problem List   Diagnosis Date Noted   Trigger finger, right little finger 04/19/2022   Impingement syndrome of right shoulder 04/19/2022   Past Medical History:  Diagnosis Date   ADHD (attention deficit hyperactivity disorder)    Arthritis    Atrial fibrillation (HCC)    hx-about 06-saw dr in [place] remeber who   Back pain    Contact lens/glasses fitting    wears contacts or glasses   Depression     Family History  Problem Relation Age of Onset   Heart disease Mother    Lung cancer Father     Past Surgical History:  Procedure Laterality Date   ABDOMINAL HYSTERECTOMY  1998   BREAST REDUCTION SURGERY Bilateral 06/23/2013   Procedure: MAMMARY REDUCTION  (BREAST);  Surgeon: Louisa Second, MD;  Location: [facility];  Service: Plastics;  Laterality: Bilateral;   CARPAL TUNNEL RELEASE     rt and lt sept,may  2014   CESAREAN SECTION     x2   COSMETIC SURGERY     stomach   DILATION AND CURETTAGE OF UTERUS     HERNIA REPAIR     ovary       rupture   TUBAL LIGATION     UPPER GI ENDOSCOPY     Social History   Occupational History   Not on file  Tobacco Use   Smoking status: Never   Smokeless tobacco: Never  Vaping Use   Vaping Use: Never used  Substance and Sexual Activity   Alcohol use: Not Currently   Drug use: Yes    Types: Marijuana   Sexual activity: Not on file     Valeria Batman, MD   Note - This record has been created using Animal nutritionist.  Chart creation errors have been sought, but may not always  have been located. Such creation errors do not reflect on  the standard of medical care.

## 2022-05-10 ENCOUNTER — Ambulatory Visit (INDEPENDENT_AMBULATORY_CARE_PROVIDER_SITE_OTHER): Payer: BC Managed Care – PPO

## 2022-05-10 ENCOUNTER — Ambulatory Visit (INDEPENDENT_AMBULATORY_CARE_PROVIDER_SITE_OTHER): Payer: BC Managed Care – PPO | Admitting: Physician Assistant

## 2022-05-10 ENCOUNTER — Encounter: Payer: Self-pay | Admitting: Physician Assistant

## 2022-05-10 DIAGNOSIS — M545 Low back pain, unspecified: Secondary | ICD-10-CM | POA: Diagnosis not present

## 2022-05-10 DIAGNOSIS — G8929 Other chronic pain: Secondary | ICD-10-CM | POA: Diagnosis not present

## 2022-05-10 NOTE — Progress Notes (Signed)
Office Visit Note   Patient: Shelia Gibbs           Date of Birth: August 26, 1961           MRN: [id number] Visit Date: 05/10/2022              Requested by: Carolan Clines, PA [address] [place],  [place] [address] PCP: Carolan Clines, [place]  Chief Complaint  Patient presents with   Lower Back - Follow-up      HPI: Patient is a pleasant 61 year old woman with a chief complaint of low back pain.  She denies any injuries.  She has previously had this about 4 years ago.  She occasionally gets coldness in her lower legs but denies any loss of strength loss of bowel or bladder control or paresthesias.  She does think it gets worse after she has been sitting a while.  She takes an occasional ibuprofen when it is bothering her.  Assessment & Plan: Visit Diagnoses:  1. Chronic midline low back pain, unspecified whether sciatica present     Plan: She does not have any radiation of symptoms down her legs today and she has good strength.  I recommend a course of physical therapy so she can learn a home exercise program we will order this today.  In the meantime she can try topical anti-inflammatories.  Tylenol as well.  Follow-up in a month after she is done therapy  Follow-Up Instructions:   Ortho Exam  Patient is alert, oriented, no adenopathy, well-dressed, normal affect, normal respiratory effort. Examination of her back she has no palpable deformities.  She has no pain with forward flexion or extension she does have some reproduction of her symptoms across her lower back both on the right and left with side to side bending.  She has 5 out of 5 strength with resisted dorsiflexion plantarflexion extension and flexion of her legs.  Imaging: XR Lumbar Spine 2-3 Views  Result Date: 05/10/2022 2 view radiographs of her lumbar spine were evaluated today.  She does have some facet degeneration at L4-5 L5-S1.  No acute fractures.  No significant advancement or progression since  x-rays taken in 2019  No images are attached to the encounter.  Labs: Lab Results  Component Value Date   ESRSEDRATE 17 05/02/2016     Lab Results  Component Value Date   ALBUMIN 3.7 03/04/2013   ALBUMIN 3.4 (L) 05/25/2012   ALBUMIN 3.6 04/29/2008    No results found for: "MG" No results found for: "VD25OH"  No results found for: "PREALBUMIN"    Latest Ref Rng & Units 06/23/2013    7:22 AM 03/04/2013    4:00 PM 05/25/2012   11:40 AM  CBC EXTENDED  WBC 4.0 - 10.5 K/uL  5.8  5.1   RBC 3.87 - 5.11 MIL/uL  4.33  4.38   Hemoglobin 12.0 - 15.0 g/dL 59.5  63.8  75.6   HCT 36.0 - 46.0 %  38.3  39.1   Platelets 150 - 400 K/uL  183  200   NEUT# 1.7 - 7.7 K/uL  2.5  2.4   Lymph# 0.7 - 4.0 K/uL  2.9  2.0      There is no height or weight on file to calculate BMI.  Orders:  Orders Placed This Encounter  Procedures   XR Lumbar Spine 2-3 Views   Ambulatory referral to Physical Therapy   No orders of the defined types were placed in this  encounter.    Procedures: No procedures performed  Clinical Data: No additional findings.  ROS:  All other systems negative, except as noted in the HPI. Review of Systems  Objective: Vital Signs: There were no vitals taken for this visit.  Specialty Comments:  No specialty comments available.  PMFS History: Patient Active Problem List   Diagnosis Date Noted   Trigger finger, right little finger 04/19/2022   Impingement syndrome of right shoulder 04/19/2022   Past Medical History:  Diagnosis Date   ADHD (attention deficit hyperactivity disorder)    Arthritis    Atrial fibrillation (HCC)    hx-about 06-saw dr in [place] who   Back pain    Contact lens/glasses fitting    wears contacts or glasses   Depression     Family History  Problem Relation Age of Onset   Heart disease Mother    Lung cancer Father     Past Surgical History:  Procedure Laterality Date   ABDOMINAL HYSTERECTOMY  1998   BREAST  REDUCTION SURGERY Bilateral 06/23/2013   Procedure: MAMMARY REDUCTION  (BREAST);  Surgeon: Louisa Second, MD;  Location: [facility];  Service: Plastics;  Laterality: Bilateral;   CARPAL TUNNEL RELEASE     rt and lt sept,may 2014   CESAREAN SECTION     x2   COSMETIC SURGERY     stomach   DILATION AND CURETTAGE OF UTERUS     HERNIA REPAIR     ovary       rupture   TUBAL LIGATION     UPPER GI ENDOSCOPY     Social History   Occupational History   Not on file  Tobacco Use   Smoking status: Never   Smokeless tobacco: Never  Vaping Use   Vaping Use: Never used  Substance and Sexual Activity   Alcohol use: Not Currently   Drug use: Yes    Types: Marijuana   Sexual activity: Not on file

## 2023-05-17 ENCOUNTER — Encounter: Payer: Self-pay | Admitting: Physician Assistant

## 2023-05-17 ENCOUNTER — Ambulatory Visit (INDEPENDENT_AMBULATORY_CARE_PROVIDER_SITE_OTHER): Payer: Medicare HMO | Admitting: Physician Assistant

## 2023-05-17 ENCOUNTER — Other Ambulatory Visit (INDEPENDENT_AMBULATORY_CARE_PROVIDER_SITE_OTHER): Payer: Medicare HMO

## 2023-05-17 DIAGNOSIS — M545 Low back pain, unspecified: Secondary | ICD-10-CM

## 2023-05-17 DIAGNOSIS — M25512 Pain in left shoulder: Secondary | ICD-10-CM

## 2023-05-17 MED ORDER — MELOXICAM 7.5 MG PO TABS: 7.5000 mg | ORAL_TABLET | Freq: Every day | ORAL | 0 refills | Status: AC
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
## 2023-06-08 ENCOUNTER — Ambulatory Visit: Payer: Medicare HMO | Admitting: Physician Assistant

## 2023-06-08 ENCOUNTER — Encounter: Payer: Self-pay | Admitting: Physician Assistant

## 2023-06-08 DIAGNOSIS — M25512 Pain in left shoulder: Secondary | ICD-10-CM | POA: Diagnosis not present

## 2023-06-08 MED ORDER — METHYLPREDNISOLONE ACETATE 40 MG/ML IJ SUSP
40.0000 mg | INTRAMUSCULAR | Status: AC | PRN
Start: 2023-06-08 — End: 2023-06-08
  Administered 2023-06-08: 40 mg via INTRA_ARTICULAR

## 2023-06-08 MED ORDER — LIDOCAINE HCL 1 % IJ SOLN
3.0000 mL | INTRAMUSCULAR | Status: AC | PRN
Start: 2023-06-08 — End: 2023-06-08
  Administered 2023-06-08: 3 mL
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
## 2023-10-22 ENCOUNTER — Ambulatory Visit: Payer: Medicare HMO | Admitting: Physician Assistant

## 2024-02-24 ENCOUNTER — Emergency Department (HOSPITAL_BASED_OUTPATIENT_CLINIC_OR_DEPARTMENT_OTHER)

## 2024-02-24 ENCOUNTER — Emergency Department (HOSPITAL_BASED_OUTPATIENT_CLINIC_OR_DEPARTMENT_OTHER)
Admission: EM | Admit: 2024-02-24 | Discharge: 2024-02-24 | Disposition: A | Discharge status: Home / Self Care | Attending: Emergency Medicine | Admitting: Emergency Medicine

## 2024-02-24 ENCOUNTER — Other Ambulatory Visit: Payer: Self-pay

## 2024-02-24 DIAGNOSIS — R55 Syncope and collapse: Secondary | ICD-10-CM

## 2024-02-24 DIAGNOSIS — R748 Abnormal levels of other serum enzymes: Secondary | ICD-10-CM | POA: Diagnosis not present

## 2024-02-24 DIAGNOSIS — R112 Nausea with vomiting, unspecified: Secondary | ICD-10-CM

## 2024-02-24 DIAGNOSIS — Z91040 Latex allergy status: Secondary | ICD-10-CM | POA: Diagnosis not present

## 2024-02-24 LAB — URINALYSIS, ROUTINE W REFLEX MICROSCOPIC
Bilirubin Urine: NEGATIVE
Glucose, UA: NEGATIVE mg/dL
Ketones, ur: NEGATIVE mg/dL
Leukocytes,Ua: NEGATIVE
Nitrite: NEGATIVE
Protein, ur: NEGATIVE mg/dL
Specific Gravity, Urine: 1.01 (ref 1.005–1.030)
pH: 6 (ref 5.0–8.0)

## 2024-02-24 LAB — CBC
HCT: 39.6 % (ref 36.0–46.0)
Hemoglobin: 13.2 g/dL (ref 12.0–15.0)
MCH: 30.3 pg (ref 26.0–34.0)
MCHC: 33.3 g/dL (ref 30.0–36.0)
MCV: 90.8 fL (ref 80.0–100.0)
Platelets: 205 10*3/uL (ref 150–400)
RBC: 4.36 MIL/uL (ref 3.87–5.11)
RDW: 12.8 % (ref 11.5–15.5)
WBC: 5.2 10*3/uL (ref 4.0–10.5)
nRBC: 0 % (ref 0.0–0.2)

## 2024-02-24 LAB — COMPREHENSIVE METABOLIC PANEL WITH GFR
ALT: 32 U/L (ref 0–44)
AST: 33 U/L (ref 15–41)
Albumin: 4.2 g/dL (ref 3.5–5.0)
Alkaline Phosphatase: 254 U/L — ABNORMAL HIGH (ref 38–126)
Anion gap: 12 (ref 5–15)
BUN: 6 mg/dL — ABNORMAL LOW (ref 8–23)
CO2: 22 mmol/L (ref 22–32)
Calcium: 8.3 mg/dL — ABNORMAL LOW (ref 8.9–10.3)
Chloride: 106 mmol/L (ref 98–111)
Creatinine, Ser: 0.51 mg/dL (ref 0.44–1.00)
GFR, Estimated: >60 mL/min (ref >60)
Glucose, Bld: 94 mg/dL (ref 70–99)
Potassium: 3.9 mmol/L (ref 3.5–5.1)
Sodium: 139 mmol/L (ref 135–145)
Total Bilirubin: 0.4 mg/dL (ref 0.0–1.2)
Total Protein: 7 g/dL (ref 6.5–8.1)

## 2024-02-24 LAB — URINALYSIS, MICROSCOPIC (REFLEX)

## 2024-02-24 LAB — CBG MONITORING, ED: Glucose-Capillary: 86 mg/dL (ref 70–99)

## 2024-02-24 LAB — LACTIC ACID, PLASMA: Lactic Acid, Venous: 0.9 mmol/L (ref 0.5–1.9)

## 2024-02-24 MED ORDER — SODIUM CHLORIDE 0.9 % IV BOLUS
1000.0000 mL | Freq: Once | INTRAVENOUS | Status: AC
  Administered 2024-02-24: 1000 mL via INTRAVENOUS

## 2024-02-24 MED ORDER — LORAZEPAM 2 MG/ML IJ SOLN
0.5000 mg | Freq: Once | INTRAMUSCULAR | Status: AC
  Administered 2024-02-24: 0.5 mg via INTRAVENOUS
  Filled 2024-02-24: qty 1
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
## 2024-02-24 NOTE — ED Provider Notes (Signed)
 [place] [facility] Provider Note   CSN: [id number] Arrival date & time: 02/24/24  1424     History  Chief Complaint  Patient presents with   Altered Mental Status    Shelia Gibbs is a 63 y.o. female.  Patient BIB family for evaluation of altered mental status, vomiting and syncope. Her daughter is at bedside who is contributing to history as patient is minimally verbal. She states the patient had a vomiting episode and passed out one week ago. Today while at church she had a similar episode. Her daughter states she seems confused. She has vomited multiple times. She does not live with her and does not know if symptoms occurred during the interim week. The patient is sleeping, wakes to verbal stimuli and answers that she has no pain, that symptoms started last week, and reports being quite cold, repeating "my feet, oh my feet" and becomes slightly agitated, moving about, not wanting to be touched or examined.   The history is provided by a relative, a friend and the patient.  Altered Mental Status      Home Medications Prior to Admission medications   Medication Sig Start Date End Date Taking? Authorizing Provider  amoxicillin-clavulanate (AUGMENTIN) 875-125 MG tablet Take 1 tablet by mouth 2 (two) times daily.    [provider]  Cyanocobalamin (VITAMIN B-12 ER PO) Take by mouth.    [provider]  estrogens, conjugated, (PREMARIN) 0.625 MG tablet Take 0.625 mg by mouth daily. Take daily for 21 days then do not take for 7 days.    [provider]  FLUoxetine (PROZAC) 20 MG tablet Take 20 mg by mouth daily.    [provider]  meloxicam  (MOBIC ) 7.5 MG tablet Take 1 tablet (7.5 mg total) by mouth daily. 05/17/23   Persons, Norma Beckers, [place]  methocarbamol  (ROBAXIN ) 500 MG tablet 1 TAB PO BID PRN 12/17/17   Shirlee Dotter, MD  naproxen  (NAPROSYN ) 500 MG tablet Take 1 tablet (500 mg total) by mouth 2 (two)  times daily. 05/25/17   Eino Gravel, MD  Nutritional Supplements (VITAMIN D MAINTENANCE PO) Take by mouth.    [provider]      Allergies    Latex and Phentermine    Review of Systems   Review of Systems  Physical Exam Updated Vital Signs BP 128/78   Pulse 70   Temp 99 F (37.2 C) (Oral)   Resp 19   SpO2 97%  Physical Exam Vitals and nursing note reviewed.  Constitutional:      General: She is not in acute distress.    Appearance: She is not toxic-appearing.  HENT:     Head: Atraumatic.     Mouth/Throat:     Mouth: Mucous membranes are moist.  Musculoskeletal:     Cervical back: Normal range of motion and neck supple.     ED Results / Procedures / Treatments   Labs (all labs ordered are listed, but only abnormal results are displayed) Labs Reviewed  COMPREHENSIVE METABOLIC PANEL WITH GFR - Abnormal; Notable for the following components:      Result Value   BUN 6 (*)    Calcium 8.3 (*)    Alkaline Phosphatase 254 (*)    All other components within normal limits  URINALYSIS, ROUTINE W REFLEX MICROSCOPIC - Abnormal; Notable for the following components:   Hgb urine dipstick TRACE (*)    All other components within normal limits  URINALYSIS,  MICROSCOPIC (REFLEX) - Abnormal; Notable for the following components:   Bacteria, UA RARE (*)    All other components within normal limits  CBC  LACTIC ACID, PLASMA  CBG MONITORING, ED  CBG MONITORING, ED   Results for orders placed or performed during the hospital encounter of 02/24/24  CBG monitoring, ED   Collection Time: 02/24/24  2:31 PM  Result Value Ref Range   Glucose-Capillary 86 70 - 99 mg/dL  Comprehensive metabolic panel   Collection Time: 02/24/24  2:45 PM  Result Value Ref Range   Sodium 139 135 - 145 mmol/L   Potassium 3.9 3.5 - 5.1 mmol/L   Chloride 106 98 - 111 mmol/L   CO2 22 22 - 32 mmol/L   Glucose, Bld 94 70 - 99 mg/dL   BUN 6 (L) 8 - 23 mg/dL   Creatinine, Ser 2.95 0.44 - 1.00 mg/dL    Calcium 8.3 (L) 8.9 - 10.3 mg/dL   Total Protein 7.0 6.5 - 8.1 g/dL   Albumin 4.2 3.5 - 5.0 g/dL   AST 33 15 - 41 U/L   ALT 32 0 - 44 U/L   Alkaline Phosphatase 254 (H) 38 - 126 U/L   Total Bilirubin 0.4 0.0 - 1.2 mg/dL   GFR, Estimated >62 >13 mL/min   Anion gap 12 5 - 15  CBC   Collection Time: 02/24/24  2:45 PM  Result Value Ref Range   WBC 5.2 4.0 - 10.5 K/uL   RBC 4.36 3.87 - 5.11 MIL/uL   Hemoglobin 13.2 12.0 - 15.0 g/dL   HCT 08.6 57.8 - 46.9 %   MCV 90.8 80.0 - 100.0 fL   MCH 30.3 26.0 - 34.0 pg   MCHC 33.3 30.0 - 36.0 g/dL   RDW 62.9 52.8 - 41.3 %   Platelets 205 150 - 400 K/uL   nRBC 0.0 0.0 - 0.2 %  Urinalysis, Routine w reflex microscopic -Urine, Clean Catch   Collection Time: 02/24/24  3:48 PM  Result Value Ref Range   Color, Urine YELLOW YELLOW   APPearance CLEAR CLEAR   Specific Gravity, Urine 1.010 1.005 - 1.030   pH 6.0 5.0 - 8.0   Glucose, UA NEGATIVE NEGATIVE mg/dL   Hgb urine dipstick TRACE (A) NEGATIVE   Bilirubin Urine NEGATIVE NEGATIVE   Ketones, ur NEGATIVE NEGATIVE mg/dL   Protein, ur NEGATIVE NEGATIVE mg/dL   Nitrite NEGATIVE NEGATIVE   Leukocytes,Ua NEGATIVE NEGATIVE  Lactic acid, plasma   Collection Time: 02/24/24  3:48 PM  Result Value Ref Range   Lactic Acid, Venous 0.9 0.5 - 1.9 mmol/L  Urinalysis, Microscopic (reflex)   Collection Time: 02/24/24  3:48 PM  Result Value Ref Range   RBC / HPF 0-5 0 - 5 RBC/hpf   WBC, UA 0-5 0 - 5 WBC/hpf   Bacteria, UA RARE (A) NONE SEEN   Squamous Epithelial / HPF 0-5 0 - 5 /HPF     EKG EKG Interpretation Date/Time:  Sunday February 24 2024 14:33:58 EDT Ventricular Rate:  87 PR Interval:  161 QRS Duration:  81 QT Interval:  388 QTC Calculation: 467 R Axis:   79  Text Interpretation: Sinus rhythm Anteroseptal infarct, age indeterminate No significant change since last tracing Confirmed by Florette Hurry [id number]) on 02/24/2024 4:08:28 PM  Radiology DG Chest Port 1 View Result Date:  02/24/2024 CLINICAL DATA:  Weakness EXAM: PORTABLE CHEST 1 VIEW COMPARISON:  None Available. FINDINGS: The heart size and mediastinal contours are within normal limits.  Both lungs are clear. The visualized skeletal structures are unremarkable. IMPRESSION: No active disease. Electronically Signed   By: Tyron Gallon M.D.   On: 02/24/2024 16:49   CT Head Wo Contrast Result Date: 02/24/2024 EXAM: CT HEAD WITHOUT 02/24/2024 03:49:00 PM TECHNIQUE: CT of the head was performed without the administration of intravenous contrast. Automated exposure control, iterative reconstruction, and/or weight based adjustment of the mA/kV was utilized to reduce the radiation dose to as low as reasonably achievable. COMPARISON: None available. CLINICAL HISTORY: Mental status change, unknown cause. FINDINGS: BRAIN AND VENTRICLES: No acute intracranial hemorrhage. No mass effect or midline shift. No extra-axial fluid collection. Gray-white differentiation is maintained. No hydrocephalus. ORBITS: No acute abnormality. SINUSES AND MASTOIDS: No acute abnormality. SOFT TISSUES AND SKULL: No acute skull fracture. No acute soft tissue abnormality. IMPRESSION: 1. No acute intracranial abnormality. Electronically signed by: Audra Blend MD 02/24/2024 04:30 PM EDT RP Workstation: [id number]    Procedures Procedures    Medications Ordered in ED Medications  sodium chloride  0.9 % bolus 1,000 mL (1,000 mLs Intravenous New Bag/Given 02/24/24 1553)  LORazepam (ATIVAN) injection 0.5 mg (0.5 mg Intravenous Given 02/24/24 1553)    ED Course/ Medical Decision Making/ A&P                                 Medical Decision Making This patient presents to the ED for concern of vomiting, syncope, this involves an extensive number of treatment options, and is a complaint that carries with it a high risk of complications and morbidity.  The differential diagnosis includes arrhythmia, infection/sepsis, CVA, mass, bleed   Co morbidities that  complicate the patient evaluation  History of atrial fibrillation (remote, not anticoagulated), ADHD   Additional history obtained:  Additional history and/or information obtained from chart review, notable for no frequent ED/emergency visits   Lab Tests:  I Ordered, and personally interpreted labs.  The pertinent results include:   Cmet: alk phos elevated 254 (documented history of same)  CBC - no leukocytosis, normal hgb and plts Lactic acid 0.9 UA - negative for infection   Imaging Studies ordered:  I ordered imaging studies including CT head, port chest Per radiologist interpretation: CT head  IMPRESSION: 1. No acute intracranial abnormality.  CXR:  IMPRESSION: No active disease.    Cardiac Monitoring:  The patient was maintained on a cardiac monitor.  I personally viewed and interpreted the cardiac monitored which showed an underlying rhythm of:  EKG Interpretation Date/Time:  Sunday February 24 2024 14:33:58 EDT Ventricular Rate:  87 PR Interval:  161 QRS Duration:  81 QT Interval:  388 QTC Calculation: 467 R Axis:   79  Text Interpretation: Sinus rhythm Anteroseptal infarct, age indeterminate No significant change since last tracing Confirmed by Florette Hurry [phone]) on 02/24/2024 4:08:28 PM   Medicines ordered and prescription drug management:  I ordered medication including n/a  for n/a Reevaluation of the patient after these medicines showed that the patient improved I have reviewed the patients home medicines and have made adjustments as needed   Test Considered:  N/a   Critical Interventions:  N/a   Consultations Obtained:  I requested consultation with the n/a,  and discussed lab and imaging findings as well as pertinent plan - they recommend: n/a   Problem List / ED Course:  Here with multiple episodes of syncope and vomiting last week then today Altered mental status on arrival as documented  in HPI On recheck, the patient is awake,  alert, in NAD, without complaint Labs unremarkable EKG sinus rhythm Imaging negative She has been seen by Dr. Delana Favors and is felt appropriate for discharge home. REfer to cardiology   Reevaluation:  After the interventions noted above, I reevaluated the patient and found that they have :resolved   Social Determinants of Health:  Never a smoker   Disposition:  After consideration of the diagnostic results and the patients response to treatment, I feel that the patient would benefit from discharge home and cardiology follow up.   Amount and/or Complexity of Data Reviewed Labs: ordered. Radiology: ordered.  Risk Prescription drug management.           Final Clinical Impression(s) / ED Diagnoses Final diagnoses:  Nausea and vomiting, unspecified vomiting type  Syncope, unspecified syncope type    Rx / DC Orders ED Discharge Orders     None         Rama Burkitt 02/24/24 1809    Dalene Duck, MD 02/24/24 [phone]

## 2024-02-24 NOTE — ED Notes (Signed)
 Patient requested to be placed on bedpan to give urine sample.

## 2024-02-24 NOTE — Discharge Instructions (Signed)
 As we discussed, you can be discharged home today. We are recommending that you follow up with cardiology for further outpatient evaluation of your symptoms.

## 2024-07-28 ENCOUNTER — Encounter: Payer: Self-pay | Admitting: Radiology
